# Patient Record
Sex: Female | Born: 1995
Health system: Southern US, Community
[De-identification: ages and names within clinical notes are randomized; demographics above are authoritative.]

## PROBLEM LIST (undated history)

## (undated) ENCOUNTER — Inpatient Hospital Stay (HOSPITAL_COMMUNITY): Payer: Self-pay

## (undated) DIAGNOSIS — F419 Anxiety disorder, unspecified: Secondary | ICD-10-CM

## (undated) DIAGNOSIS — N83209 Unspecified ovarian cyst, unspecified side: Secondary | ICD-10-CM

## (undated) DIAGNOSIS — I493 Ventricular premature depolarization: Secondary | ICD-10-CM

## (undated) DIAGNOSIS — O1493 Unspecified pre-eclampsia, third trimester: Secondary | ICD-10-CM

## (undated) DIAGNOSIS — R519 Headache, unspecified: Secondary | ICD-10-CM

## (undated) DIAGNOSIS — F32A Depression, unspecified: Secondary | ICD-10-CM

## (undated) DIAGNOSIS — N390 Urinary tract infection, site not specified: Secondary | ICD-10-CM

## (undated) HISTORY — PX: LAPAROSCOPY: SHX197

## (undated) HISTORY — PX: CYSTOSCOPY: SUR368

## (undated) HISTORY — PX: WISDOM TOOTH EXTRACTION: SHX21

---
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
## 2014-02-03 IMAGING — CT CT ABD-PELV W/ CM
1 of 2 series · 14 of 32 positions shown, 18 images · IV contrast (OMNIPAQUE 300)
Comparison: CT abdomen pelvis - 10/08/2011

CLINICAL DATA: Abdominal pain, nausea and vomiting, right lower
quadrant pain for 1 day

CT ABDOMEN AND PELVIS WITH CONTRAST
TECHNIQUE: Multidetector CT imaging of the abdomen and pelvis was
performed following the standard protocol during bolus
administration of intravenous contrast.
Contrast: 100mL OMNIPAQUE IOHEXOL 300 MG/ML  SOLN

[Series 2: abd/pel with · axial · 0.58mm/px · z∈[-524,-124]mm · 14 of 88 slices shown, 18 images]
[im 4/88  soft-tissue]
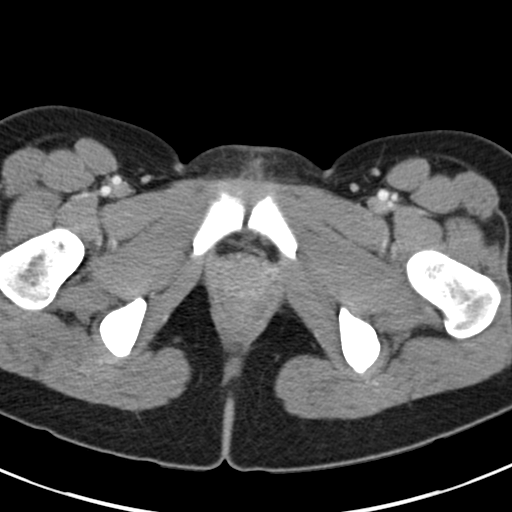
[im 4/88  bone]
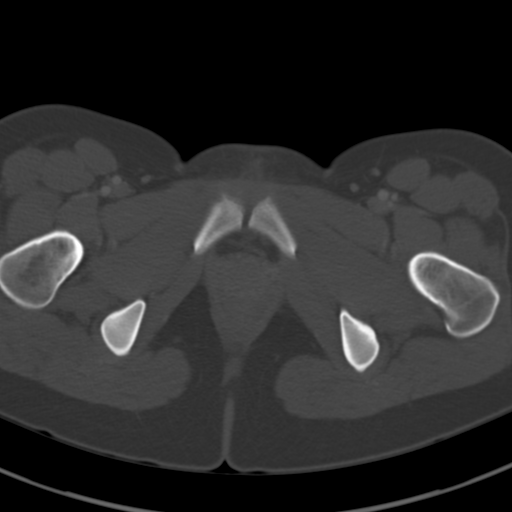
[im 11/88  soft-tissue]
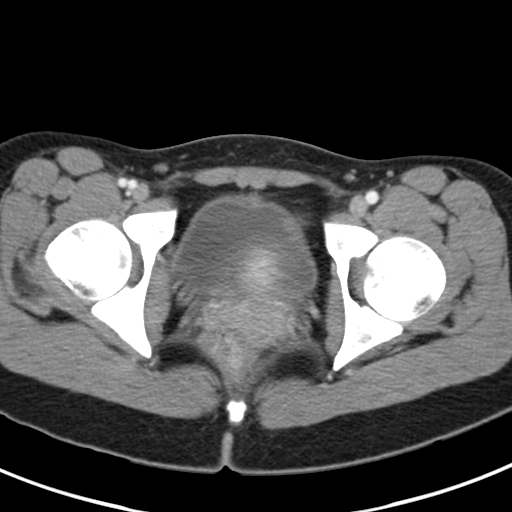
[im 19/88  soft-tissue]
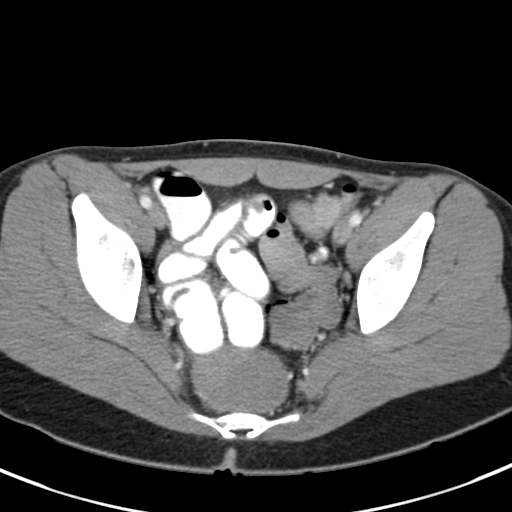
[im 26/88  soft-tissue]
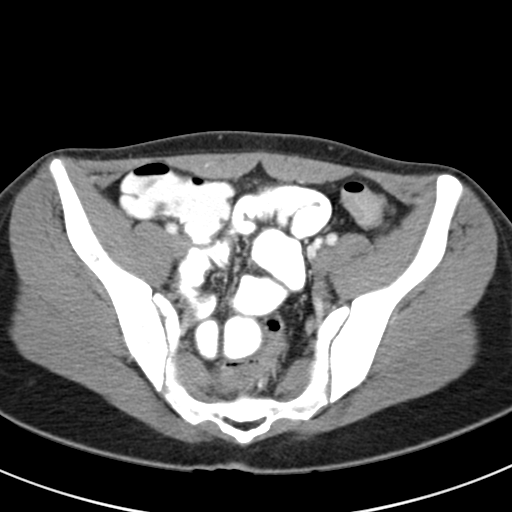
[im 33/88  soft-tissue]
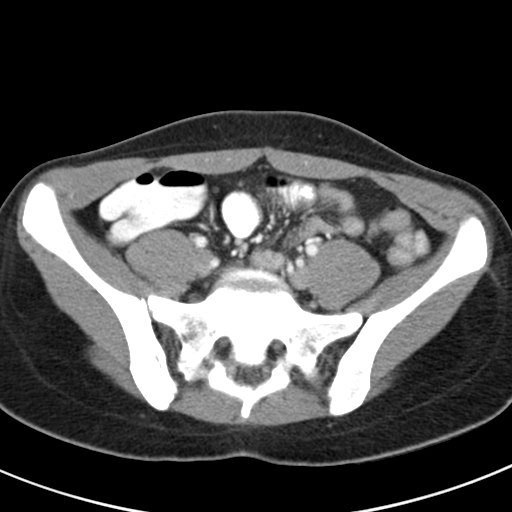
[im 40/88  soft-tissue]
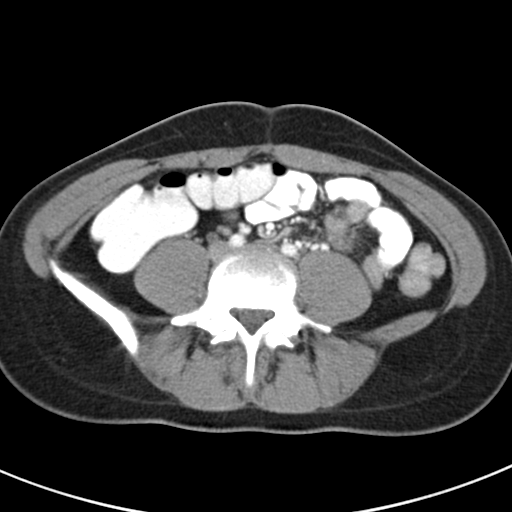
[im 48/88  soft-tissue]
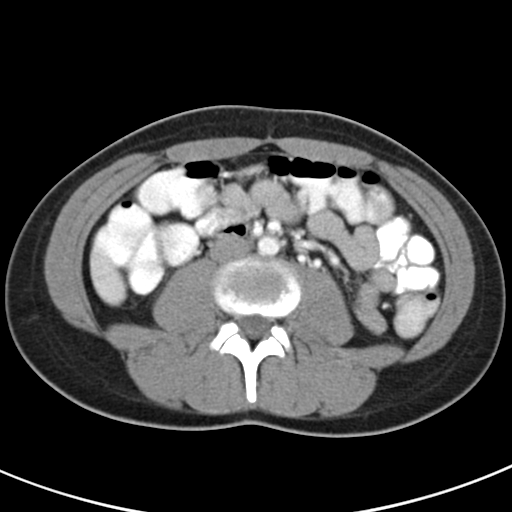
[im 55/88  soft-tissue]
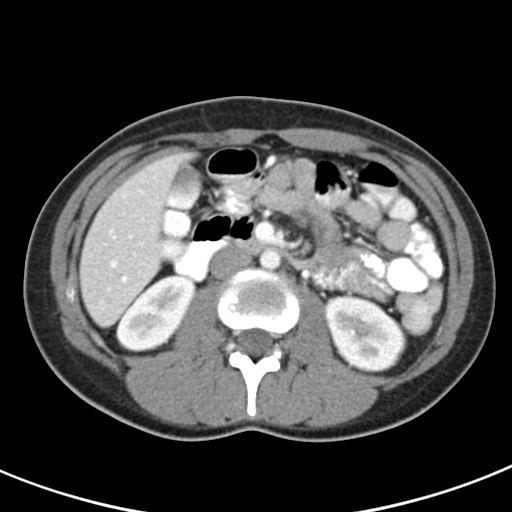
[im 62/88  soft-tissue]
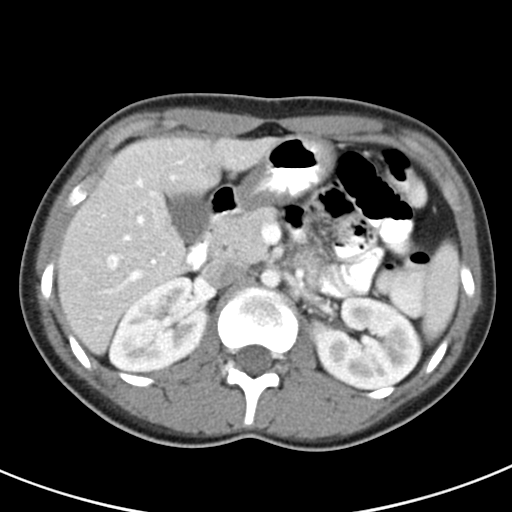
[im 62/88  bone]
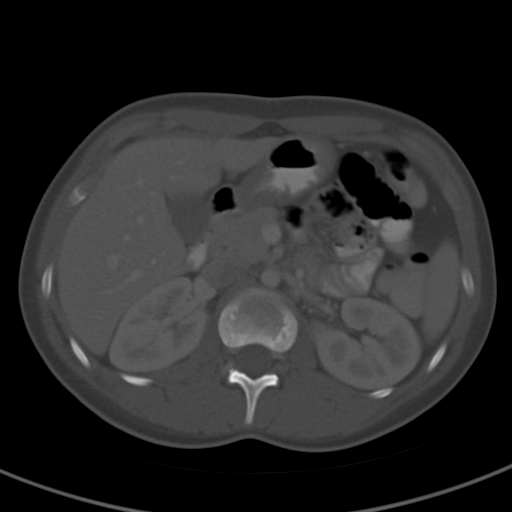
[im 69/88  soft-tissue]
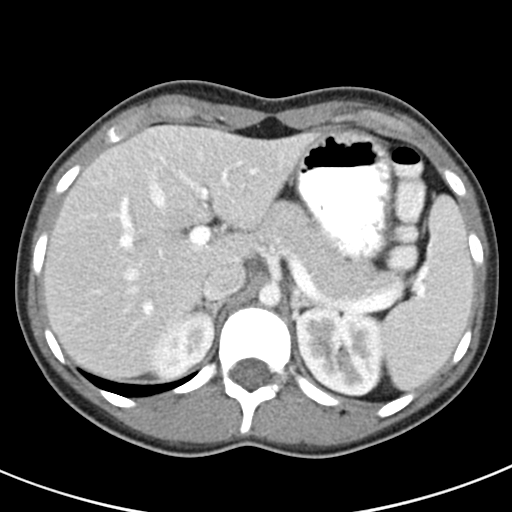
[im 73/88  lung]
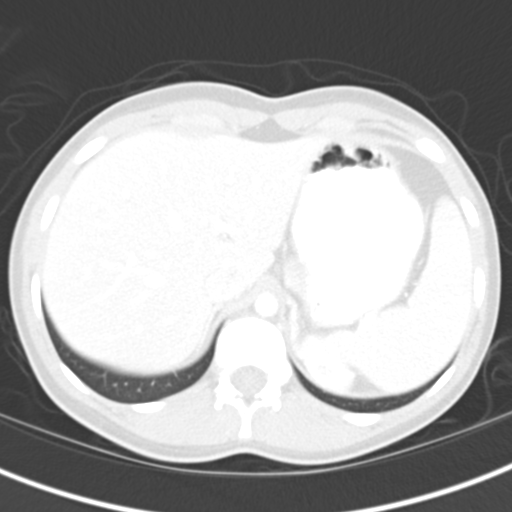
[im 77/88  soft-tissue]
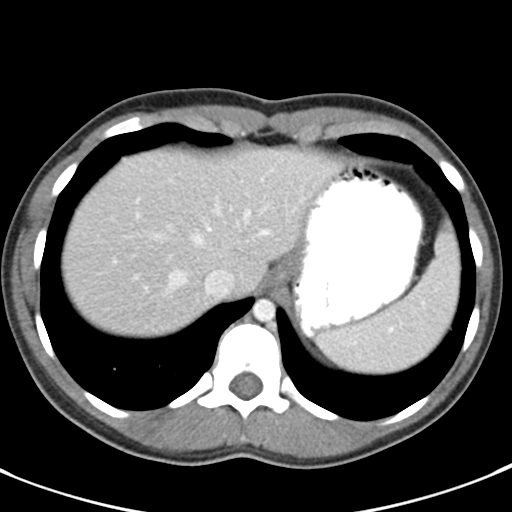
[im 77/88  lung]
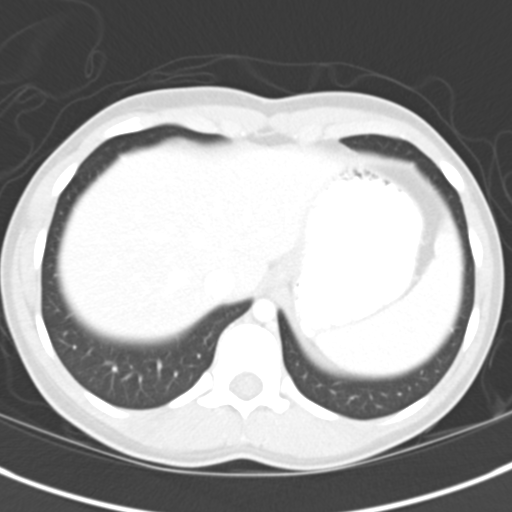
[im 80/88  lung]
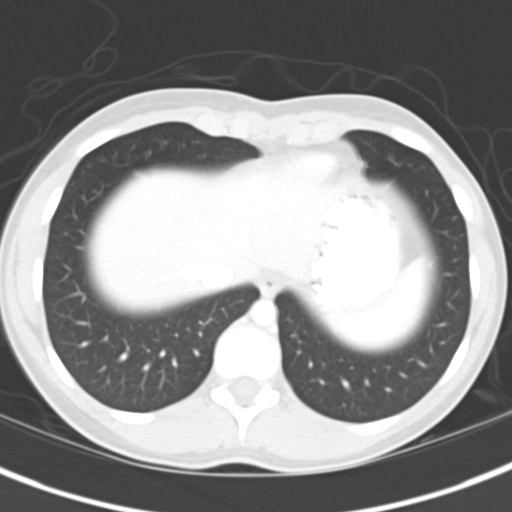
[im 84/88  soft-tissue]
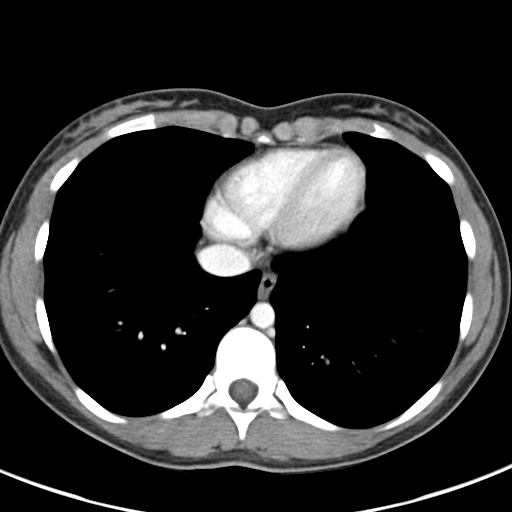
[im 84/88  lung]
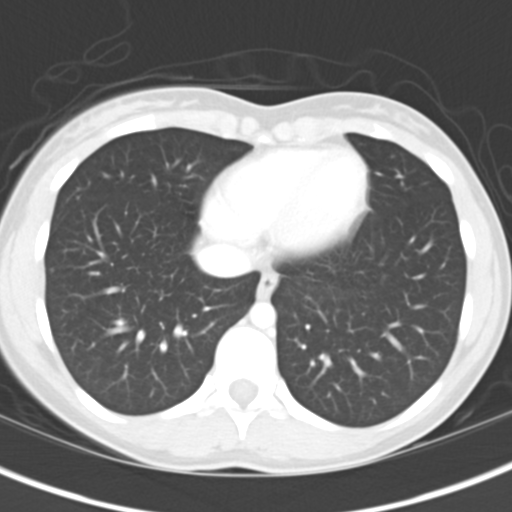

[14 of 32 positions shown; findings below may reference images not displayed]

FINDINGS: Normal hepatic contour.  No discrete hepatic lesions.  Normal
appearance of the gallbladder.  No intrahepatic biliary duct
dilatation.  No ascites.

There is symmetric enhancement of the bilateral kidneys.  No
discrete renal lesions.  No discrete renal stones on this
postcontrast examination.  No urinary obstruction or perinephric
stranding.  Normal appearance of the bilateral adrenal glands,
pancreas and spleen.

Ingested enteric contrast extends to the level of the descending
colon.  No evidence of enteric obstruction. The bowel is normal in
course and caliber without wall thickening or evidence of
obstruction.  Normal appearance of the retrocecal appendix (axial
image 58 through 60, series 2). No pneumoperitoneum, pneumatosis or
portal venous gas.

Normal caliber of the abdominal aorta.  The major branch vessels of
the abdominal aorta are patent.   Index shoddy mesenteric node in
the right lower abdominal quadrant measures 6 mm in short axis
diameter (52, series 2), not enlarged by CT criteria.  Scattered
shoddy retroperitoneal lymph nodes are not enlarged by CT criteria.
No definitive retroperitoneal, mesenteric, pelvic or inguinal
lymphadenopathy.

There is a small amount of fluid within the endometrial canal,
presumably physiologic.  No discrete adnexal lesion. Trace amount
of fluid within the right side of the pelvis (image 73, series 2),
presumably physiologic.

Limited visualization of the lower thorax is negative for focal
airspace opacity or pleural effusion.  Normal heart size.  No
pericardial effusion.

No acute or aggressive osseous abnormalities.
IMPRESSION: 1.  No explanation for patient's nausea, vomiting and right lower
quadrant abdominal pain.  Specifically, no evidence of enteric or
urinary obstruction.  Normal appearance of the appendix.

2.  Small amount presumably physiologic fluid within the
endometrial canal and within the right hemi pelvis.  No discrete
adnexal lesion.

## 2014-02-03 IMAGING — US US TRANSVAGINAL NON-OB
1 series · 13 of 25 positions shown · non-contrast
Comparison: CT scan 06/25/2012.

CLINICAL DATA: Pelvic pain.

TRANSABDOMINAL AND TRANSVAGINAL ULTRASOUND OF PELVIS
DOPPLER ULTRASOUND OF OVARIES
TECHNIQUE: Both transabdominal and transvaginal ultrasound
examinations of the pelvis were performed. Transabdominal technique
was performed for global imaging of the pelvis including uterus,
ovaries, adnexal regions, and pelvic cul-de-sac.
It was necessary to proceed with endovaginal exam following the
transabdominal exam to visualize the ovaries and endometrium.
Color and duplex Doppler ultrasound was utilized to evaluate blood
flow to the ovaries.

[Series 1: us transvaginal non-ob · 0.30mm/px · 13 of 48 slices shown]
[im 1/48]
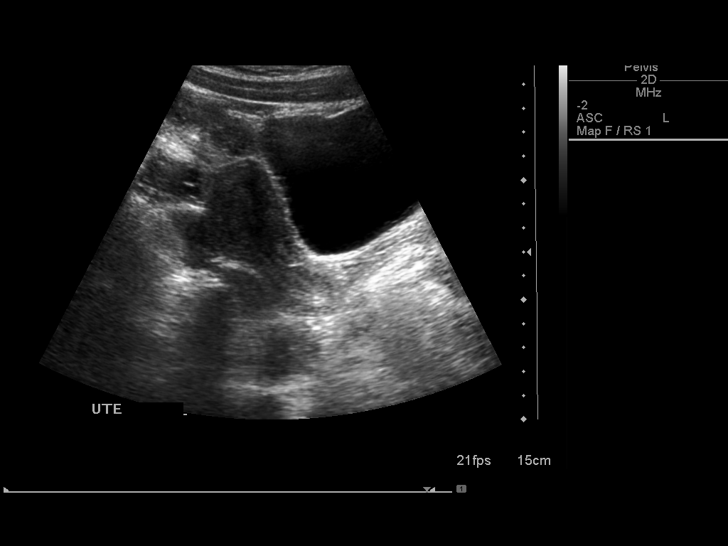
[im 4/48]
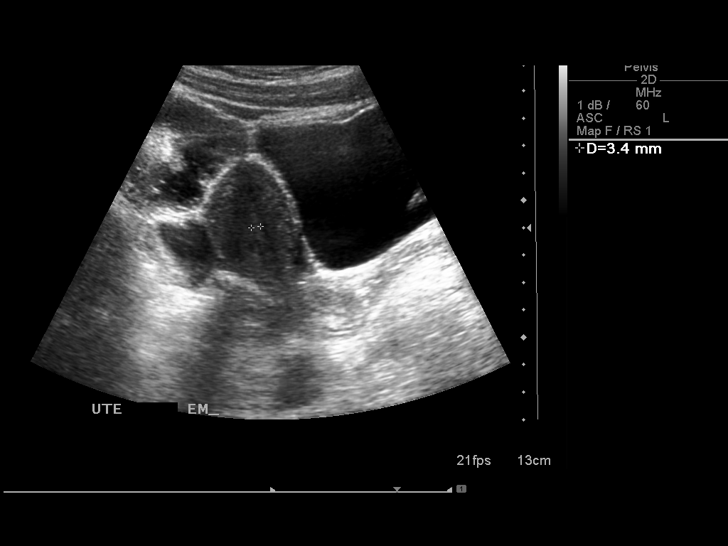
[im 8/48]
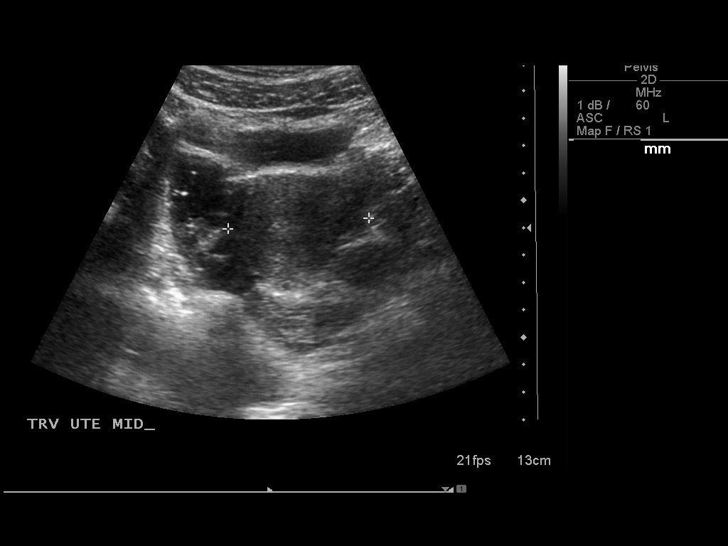
[im 12/48]
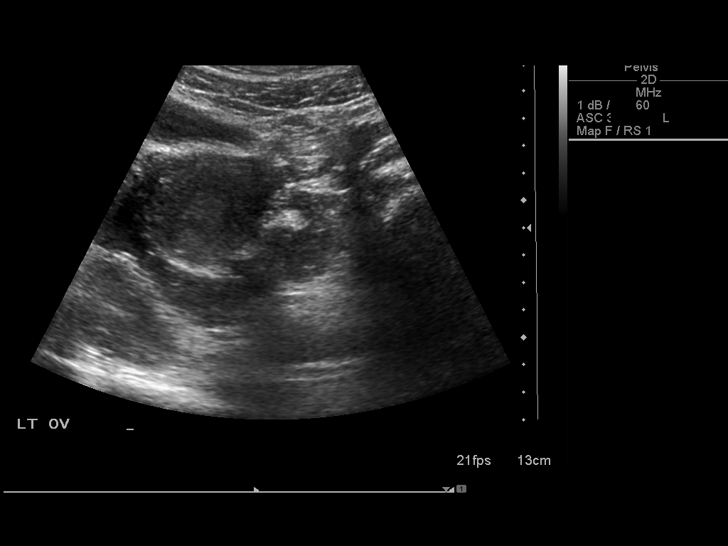
[im 16/48]
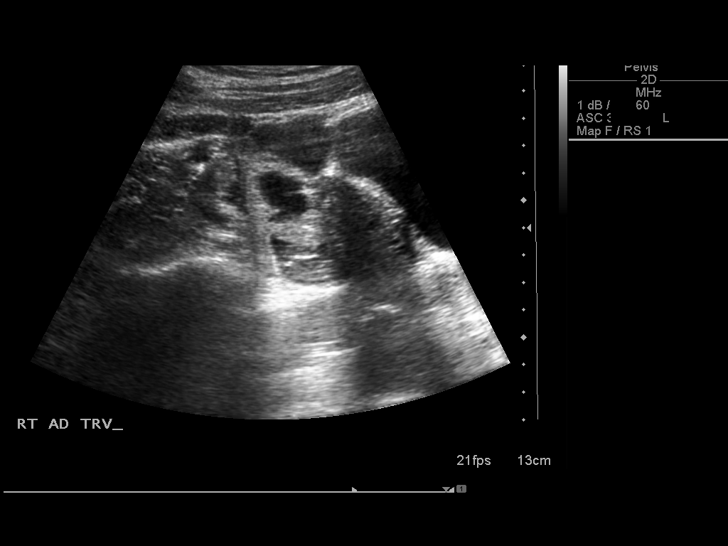
[im 20/48]
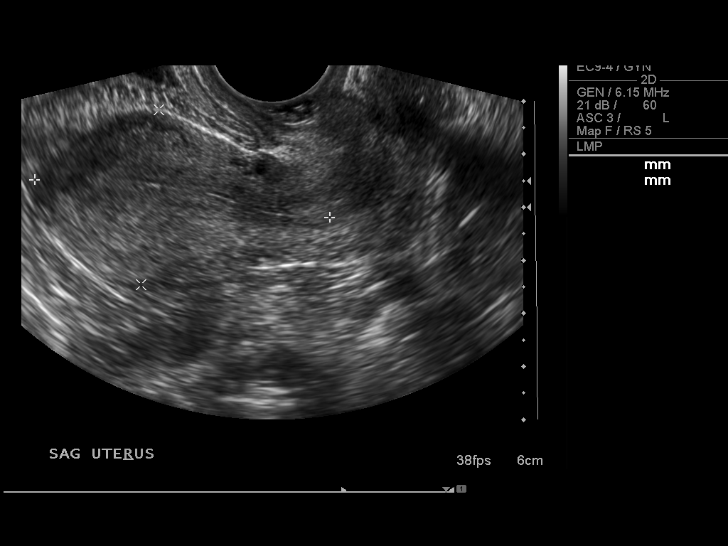
[im 24/48]
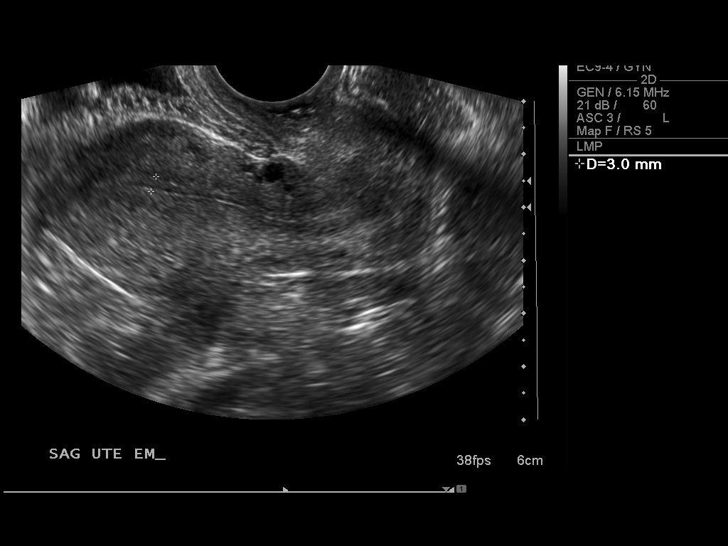
[im 28/48]
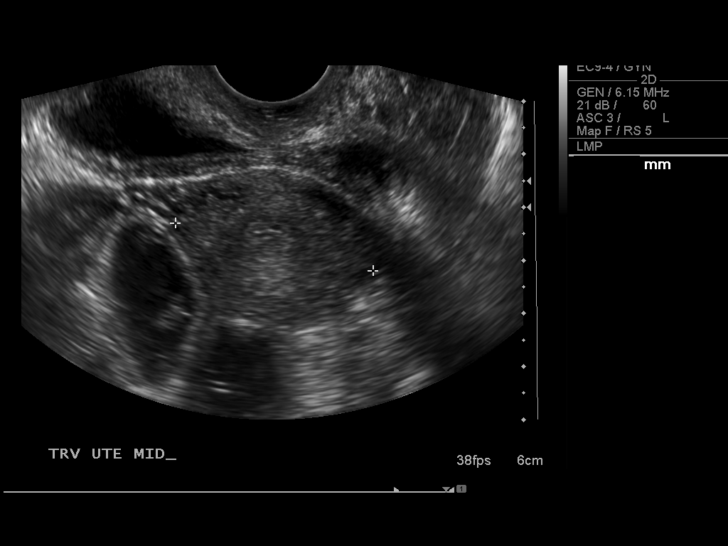
[im 32/48]
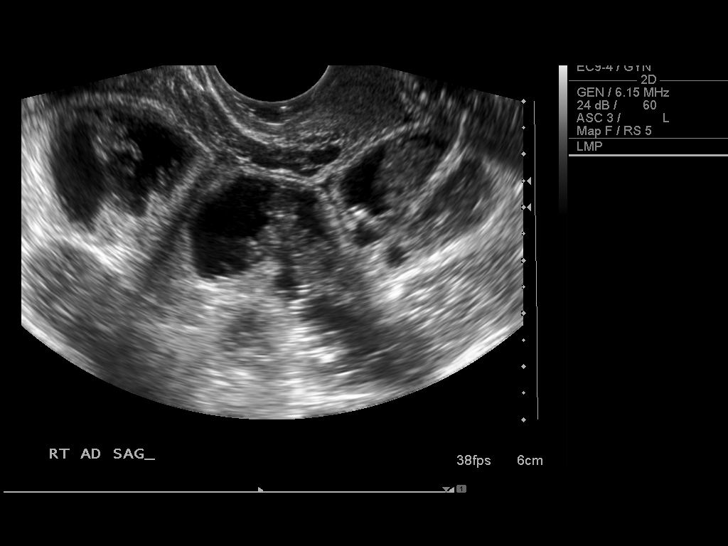
[im 36/48]
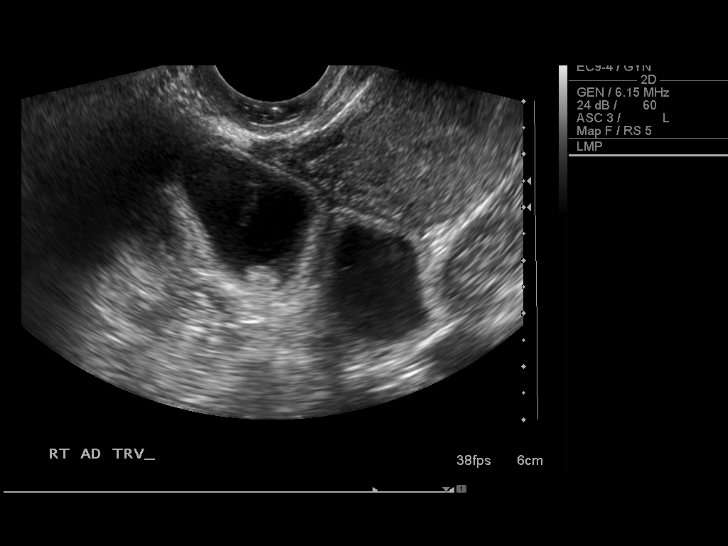
[im 40/48]
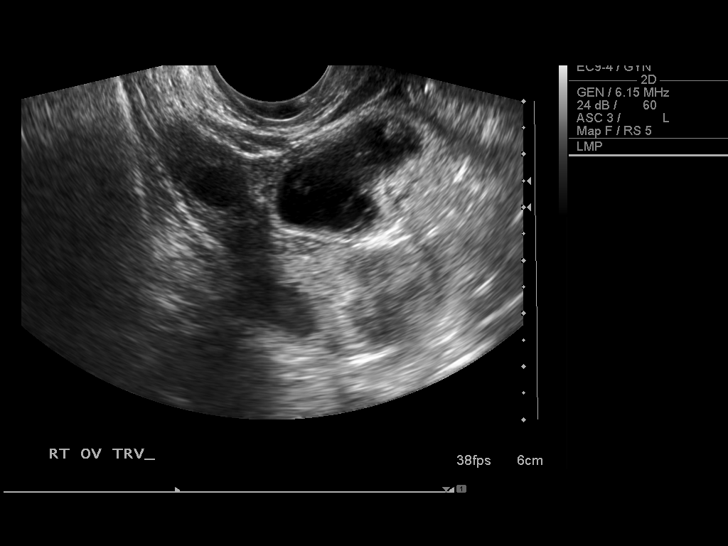
[im 44/48]
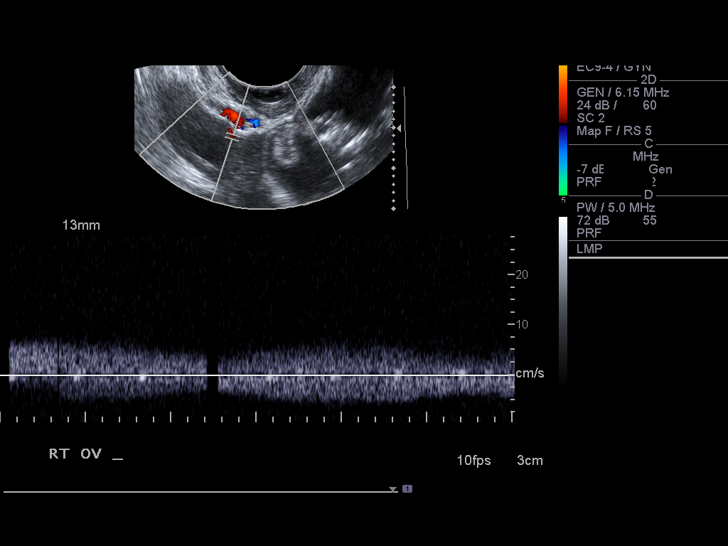
[im 48/48]
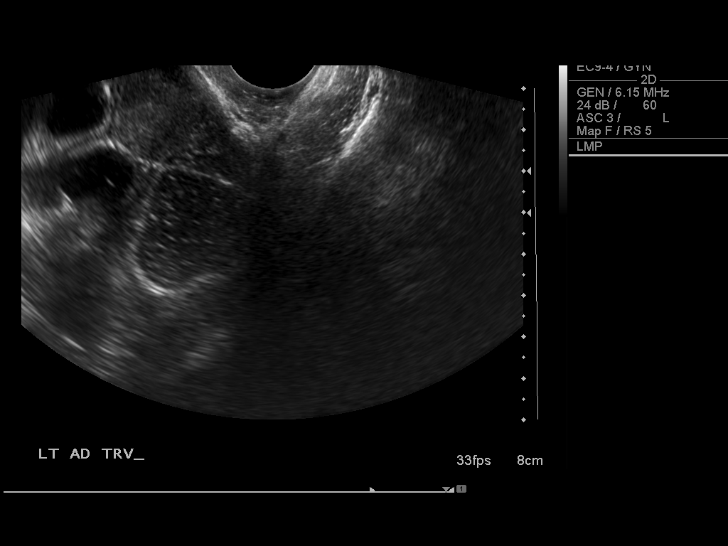

[13 of 25 positions shown; findings below may reference images not displayed]

FINDINGS: Uterus:  Measures 7.9 x 3.4 x 5.2 cm.  No myometrial abnormalities.

Endometrium:  Normal in thickness measuring a maximum of 3 mm.

Right ovary: Measures 2.7 x 1.5 x 2.6 cm.

Left ovary:   Not definitely visualized but appears normal on the
CT scan.

Pulsed Doppler evaluation demonstrates normal low-resistance
arterial and venous waveforms in the right ovary.
IMPRESSION: 1.  Normal sonographic appearance of the uterus and right ovary.
2.  The left ovary is not visualized for certain but it is
identified on the CT scan and appears normal.

No sonographic evidence for ovarian torsion.

## 2014-12-28 ENCOUNTER — Emergency Department: Payer: BLUE CROSS/BLUE SHIELD

## 2014-12-28 ENCOUNTER — Emergency Department
Admission: EM | Admit: 2014-12-28 | Discharge: 2014-12-28 | Disposition: A | Discharge status: Home / Self Care | Payer: BLUE CROSS/BLUE SHIELD | Attending: Emergency Medicine | Admitting: Emergency Medicine

## 2014-12-28 ENCOUNTER — Encounter: Payer: Self-pay | Admitting: *Deleted

## 2014-12-28 DIAGNOSIS — Z3202 Encounter for pregnancy test, result negative: Secondary | ICD-10-CM | POA: Diagnosis not present

## 2014-12-28 DIAGNOSIS — Z79899 Other long term (current) drug therapy: Secondary | ICD-10-CM | POA: Diagnosis not present

## 2014-12-28 DIAGNOSIS — Z793 Long term (current) use of hormonal contraceptives: Secondary | ICD-10-CM | POA: Diagnosis not present

## 2014-12-28 DIAGNOSIS — R102 Pelvic and perineal pain: Secondary | ICD-10-CM

## 2014-12-28 DIAGNOSIS — N8320 Unspecified ovarian cysts: Secondary | ICD-10-CM | POA: Diagnosis not present

## 2014-12-28 DIAGNOSIS — N83209 Unspecified ovarian cyst, unspecified side: Secondary | ICD-10-CM

## 2014-12-28 HISTORY — DX: Unspecified ovarian cyst, unspecified side: N83.209

## 2014-12-28 LAB — URINALYSIS COMPLETE WITH MICROSCOPIC (ARMC ONLY)
BILIRUBIN URINE: NEGATIVE
Glucose, UA: NEGATIVE mg/dL
KETONES UR: NEGATIVE mg/dL
Nitrite: NEGATIVE
PH: 7 (ref 5.0–8.0)
Protein, ur: NEGATIVE mg/dL
Specific Gravity, Urine: 1.019 (ref 1.005–1.030)

## 2014-12-28 LAB — POCT PREGNANCY, URINE: PREG TEST UR: NEGATIVE

## 2014-12-28 MED ORDER — OXYCODONE-ACETAMINOPHEN 5-325 MG PO TABS: 1.0000 | ORAL_TABLET | Freq: Four times a day (QID) | ORAL | Status: DC | PRN

## 2014-12-28 MED ORDER — ONDANSETRON 8 MG PO TBDP
8.0000 mg | ORAL_TABLET | Freq: Once | ORAL | Status: AC
  Administered 2014-12-28: 8 mg via ORAL
  Filled 2014-12-28: qty 1

## 2014-12-28 MED ORDER — HYDROMORPHONE HCL 1 MG/ML IJ SOLN
1.0000 mg | Freq: Once | INTRAMUSCULAR | Status: AC
  Administered 2014-12-28: 1 mg via INTRAMUSCULAR
  Filled 2014-12-28: qty 1

## 2014-12-28 MED ORDER — OXYCODONE-ACETAMINOPHEN 5-325 MG PO TABS
1.0000 | ORAL_TABLET | Freq: Once | ORAL | Status: AC
  Administered 2014-12-28: 1 via ORAL
  Filled 2014-12-28: qty 1

## 2014-12-28 MED ORDER — ONDANSETRON 8 MG PO TBDP: 8.0000 mg | ORAL_TABLET | Freq: Three times a day (TID) | ORAL | Status: DC | PRN

## 2014-12-28 NOTE — Discharge Instructions (Signed)
Ovarian Cyst °An ovarian cyst is a fluid-filled sac that forms on an ovary. The ovaries are small organs that produce eggs in women. Various types of cysts can form on the ovaries. Most are not cancerous. Many do not cause problems, and they often go away on their own. Some may cause symptoms and require treatment. Common types of ovarian cysts include: °· Functional cysts--These cysts may occur every month during the menstrual cycle. This is normal. The cysts usually go away with the next menstrual cycle if the woman does not get pregnant. Usually, there are no symptoms with a functional cyst. °· Endometrioma cysts--These cysts form from the tissue that lines the uterus. They are also called "chocolate cysts" because they become filled with blood that turns brown. This type of cyst can cause pain in the lower abdomen during intercourse and with your menstrual period. °· Cystadenoma cysts--This type develops from the cells on the outside of the ovary. These cysts can get very big and cause lower abdomen pain and pain with intercourse. This type of cyst can twist on itself, cut off its blood supply, and cause severe pain. It can also easily rupture and cause a lot of pain. °· Dermoid cysts--This type of cyst is sometimes found in both ovaries. These cysts may contain different kinds of body tissue, such as skin, teeth, hair, or cartilage. They usually do not cause symptoms unless they get very big. °· Theca lutein cysts--These cysts occur when too much of a certain hormone (human chorionic gonadotropin) is produced and overstimulates the ovaries to produce an egg. This is most common after procedures used to assist with the conception of a baby (in vitro fertilization). °CAUSES  °· Fertility drugs can cause a condition in which multiple large cysts are formed on the ovaries. This is called ovarian hyperstimulation syndrome. °· A condition called polycystic ovary syndrome can cause hormonal imbalances that can lead to  nonfunctional ovarian cysts. °SIGNS AND SYMPTOMS  °Many ovarian cysts do not cause symptoms. If symptoms are present, they may include: °· Pelvic pain or pressure. °· Pain in the lower abdomen. °· Pain during sexual intercourse. °· Increasing girth (swelling) of the abdomen. °· Abnormal menstrual periods. °· Increasing pain with menstrual periods. °· Stopping having menstrual periods without being pregnant. °DIAGNOSIS  °These cysts are commonly found during a routine or annual pelvic exam. Tests may be ordered to find out more about the cyst. These tests may include: °· Ultrasound. °· X-ray of the pelvis. °· CT scan. °· MRI. °· Blood tests. °TREATMENT  °Many ovarian cysts go away on their own without treatment. Your health care provider may want to check your cyst regularly for 2-3 months to see if it changes. For women in menopause, it is particularly important to monitor a cyst closely because of the higher rate of ovarian cancer in menopausal women. When treatment is needed, it may include any of the following: °· A procedure to drain the cyst (aspiration). This may be done using a long needle and ultrasound. It can also be done through a laparoscopic procedure. This involves using a thin, lighted tube with a tiny camera on the end (laparoscope) inserted through a small incision. °· Surgery to remove the whole cyst. This may be done using laparoscopic surgery or an open surgery involving a larger incision in the lower abdomen. °· Hormone treatment or birth control pills. These methods are sometimes used to help dissolve a cyst. °HOME CARE INSTRUCTIONS  °· Only take over-the-counter   or prescription medicines as directed by your health care provider. °· Follow up with your health care provider as directed. °· Get regular pelvic exams and Pap tests. °SEEK MEDICAL CARE IF:  °· Your periods are late, irregular, or painful, or they stop. °· Your pelvic pain or abdominal pain does not go away. °· Your abdomen becomes  larger or swollen. °· You have pressure on your bladder or trouble emptying your bladder completely. °· You have pain during sexual intercourse. °· You have feelings of fullness, pressure, or discomfort in your stomach. °· You lose weight for no apparent reason. °· You feel generally ill. °· You become constipated. °· You lose your appetite. °· You develop acne. °· You have an increase in body and facial hair. °· You are gaining weight, without changing your exercise and eating habits. °· You think you are pregnant. °SEEK IMMEDIATE MEDICAL CARE IF:  °· You have increasing abdominal pain. °· You feel sick to your stomach (nauseous), and you throw up (vomit). °· You develop a fever that comes on suddenly. °· You have abdominal pain during a bowel movement. °· Your menstrual periods become heavier than usual. °MAKE SURE YOU: °· Understand these instructions. °· Will watch your condition. °· Will get help right away if you are not doing well or get worse. °Document Released: 05/16/2005 Document Revised: 05/21/2013 Document Reviewed: 01/21/2013 °[employer]® Patient Information ©2015 [employer]. This information is not intended to replace advice given to you by your health care provider. Make sure you discuss any questions you have with your health care provider. ° ° °You were prescribed a medication that is potentially sedating. Do not drink alcohol, drive or participate in any other potentially dangerous activities while taking this medication as it may make you sleepy. Do not take this medication with any other sedating medications, either prescription or over-the-counter. If you were prescribed Percocet or Vicodin, do not take these with acetaminophen (Tylenol) as it is already contained within these medications. °  °Opioid pain medications (or "narcotics") can be habit forming.  Use it as little as possible to achieve adequate pain control.  Do not use or use it with extreme caution if you have a history of opiate  abuse or dependence.  If you are on a pain contract with your primary care doctor or a pain specialist, be sure to let them know you were prescribed this medication today from the [facility].  This medication is intended for your use only - do not give any to anyone else and keep it in a secure place where nobody else, especially children and pets, have access to it.  It will also cause or worsen constipation, so you may want to consider taking an over-the-counter stool softener while you are taking this medication. ° °

## 2014-12-28 NOTE — ED Notes (Addendum)
[place] reports about 30 minutes until she's ready for pt; visitors x 2 at bedside;

## 2014-12-28 NOTE — ED Notes (Signed)
Pt returned from [place]; rates pain 8/10 after procedure; pt says pain had gone completely away before she went to have that performed

## 2014-12-28 NOTE — ED Notes (Signed)
Dr Scotty Court notified of pt's increased pain; order for 1 percocet given

## 2014-12-28 NOTE — ED Notes (Signed)
MD at bedside to follow up with results 

## 2014-12-28 NOTE — ED Notes (Signed)
Pt c/o R pelvic pain. Pt has hx of ovarian cysts. Pt had no meds to take for pain control at this time. Pt states normally she maintains episodes of cystic pain w/ OTC meds or tramadol. Pt states sudden onset of pain when she woke to use the bathroom and this is atypical of her ovarian cystic pain. Pt denies pregnancy at this time. Pt denies discharge and bleeding at this time, Pt denies dysuria. Pt c/o nausea, denies vomiting.

## 2014-12-28 NOTE — ED Provider Notes (Signed)
[facility] Emergency Department Provider Note  ____________________________________________  Time seen: 2:35 AM  I have reviewed the triage vital signs and the nursing notes.   HISTORY  Chief Complaint Pelvic Pain    HPI Sheila Sexton is a 19 y.o. female who complains of sudden suprapubic and right lower quadrant pain that started at about 1 AM. She reports a history of ovarian cyst, and she was at home and her usual state of health when she was coming back for the bathroom and changing position to sit down on the couch when she had the sudden onset of pain. It radiated to the back and the lower back area. No nausea vomiting chest pain shortness breath fevers chills. No dysuria frequency urgency or vaginal bleeding or discharge. Pain is been constant since onset. It is severe in intensity. Lambert Mody     Past Medical History  Diagnosis Date  . Ovarian cyst     There are no active problems to display for this patient.   History reviewed. No pertinent past surgical history.  Current Outpatient Rx  Name  Route  Sig  Dispense  Refill  . norethindrone-ethinyl estradiol (JUNEL FE,GILDESS FE,LOESTRIN FE) 1-20 MG-MCG tablet   Oral   Take 1 tablet by mouth daily.         . fluconazole (DIFLUCAN) 150 MG tablet      Take one tab PO   1 tablet   0   . omeprazole (PRILOSEC) 40 MG capsule   Oral   Take 40 mg by mouth daily.         . ondansetron (ZOFRAN ODT) 8 MG disintegrating tablet   Oral   Take 1 tablet (8 mg total) by mouth every 8 (eight) hours as needed for nausea or vomiting.   20 tablet   0   . oxyCODONE-acetaminophen (ROXICET) 5-325 MG per tablet   Oral   Take 1 tablet by mouth every 6 (six) hours as needed for severe pain.   12 tablet   0   . promethazine (PHENERGAN) 12.5 MG tablet   Oral   Take 1 tablet (12.5 mg total) by mouth every 6 (six) hours as needed for nausea.   20 tablet   0   . traMADol (ULTRAM) 50 MG tablet   Oral  Take 50 mg by mouth every 6 (six) hours as needed. pain           Allergies Review of patient's allergies indicates no known allergies.  History reviewed. No pertinent family history.  Social History History  Substance Use Topics  . Smoking status: Never Smoker   . Smokeless tobacco: Never Used  . Alcohol Use: No    Review of Systems  Constitutional: No fever or chills. No weight changes Eyes:No blurry vision or double vision.  ENT: No sore throat. Cardiovascular: No chest pain. Respiratory: No dyspnea or cough. Gastrointestinal: Positive suprapubic abdominal pain.  No BRBPR or melena. Genitourinary: Negative for dysuria, urinary retention, bloody urine, or difficulty urinating. Musculoskeletal: Negative for back pain. No joint swelling or pain. Skin: Negative for rash. Neurological: Negative for headaches, focal weakness or numbness. Psychiatric:No anxiety or depression.   Endocrine:No hot/cold intolerance, changes in energy, or sleep difficulty.  10-point ROS otherwise negative.  ____________________________________________   PHYSICAL EXAM:  VITAL SIGNS: ED Triage Vitals  Enc Vitals Group     BP 12/28/14 0156 160/83 mmHg     Pulse Rate 12/28/14 0156 123     Resp 12/28/14 0156 24  Temp 12/28/14 0156 98.2 F (36.8 C)     Temp Source 12/28/14 0156 Oral     SpO2 12/28/14 0156 97 %     Weight 12/28/14 0156 140 lb (63.504 kg)     Height 12/28/14 0156  (1.676 m)     Head Cir --      Peak Flow --      Pain Score 12/28/14 0157 10     Pain Loc --      Pain Edu? --      Excl. in GC? --      Constitutional: Alert and oriented. Well appearing and in no distress. Eyes: No scleral icterus. No conjunctival pallor. PERRL. EOMI ENT   Head: Normocephalic and atraumatic.   Nose: No congestion/rhinnorhea. No septal hematoma   Mouth/Throat: MMM, no pharyngeal erythema. No peritonsillar mass. No uvula shift.   Neck: No stridor. No SubQ emphysema.  No meningismus. Hematological/Lymphatic/Immunilogical: No cervical lymphadenopathy. Cardiovascular: RRR. Normal and symmetric distal pulses are present in all extremities. No murmurs, rubs, or gallops. Respiratory: Normal respiratory effort without tachypnea nor retractions. Breath sounds are clear and equal bilaterally. No wheezes/rales/rhonchi. Gastrointestinal: Suprapubic tenderness. No distention. There is no CVA tenderness.  No rebound, rigidity, or guarding. Genitourinary: deferred Musculoskeletal: Nontender with normal range of motion in all extremities. No joint effusions.  No lower extremity tenderness.  No edema. Neurologic:   Normal speech and language.  CN 2-10 normal. Motor grossly intact. No pronator drift.  Normal gait. No gross focal neurologic deficits are appreciated.  Skin:  Skin is warm, dry and intact. No rash noted.  No petechiae, purpura, or bullae. Psychiatric: Mood and affect are normal. Speech and behavior are normal. Patient exhibits appropriate insight and judgment.  ____________________________________________    LABS (pertinent positives/negatives) (all labs ordered are listed, but only abnormal results are displayed) Labs Reviewed  URINALYSIS COMPLETEWITH MICROSCOPIC ([facility] ONLY) - Abnormal; Notable for the following:    Color, Urine YELLOW (*)    APPearance CLOUDY (*)    Hgb urine dipstick 1+ (*)    Leukocytes, UA TRACE (*)    Bacteria, UA RARE (*)    Squamous Epithelial / LPF 6-30 (*)    All other components within normal limits  POCT PREGNANCY, URINE   ____________________________________________   EKG    ____________________________________________    RADIOLOGY  Ultrasound pelvis including transvaginal and Dopplers indicates a left ovarian hemorrhagic cyst with evidence of recent rupture  ____________________________________________   PROCEDURES  ____________________________________________   INITIAL IMPRESSION / ASSESSMENT AND  PLAN / ED COURSE  Pertinent labs & imaging results that were available during my care of the patient were reviewed by me and considered in my medical decision making (see chart for details).  Urinalysis and urine pregnancy negative. Ultrasound consistent with hemorrhagic ovarian cyst that is ruptured causing the pain. No evidence of significant anemia or blood loss. Control symptoms with Zofran and Percocet and have her follow-up with her gynecologist with whom she has established care. She'll call her gynecologist for an appointment within the next few weeks.    ____________________________________________   FINAL CLINICAL IMPRESSION(S) / ED DIAGNOSES  Final diagnoses:  Pelvic pain in female  Hemorrhagic ovarian cyst      Sharman Cheek, MD 12/28/14 0530

## 2014-12-28 NOTE — ED Notes (Signed)
Pt to US.

## 2017-02-22 ENCOUNTER — Ambulatory Visit (HOSPITAL_COMMUNITY)
Admission: EM | Admit: 2017-02-22 | Discharge: 2017-02-22 | Disposition: A | Discharge status: Other Acute Inpatient Hospital | Payer: BLUE CROSS/BLUE SHIELD

## 2017-02-22 ENCOUNTER — Emergency Department (HOSPITAL_COMMUNITY)
Admission: EM | Admit: 2017-02-22 | Discharge: 2017-02-23 | Discharge status: Against Medical Advice | Payer: BLUE CROSS/BLUE SHIELD

## 2017-02-22 NOTE — ED Notes (Signed)
Pt told registration that she wanted to leave without being seen

## 2017-07-14 ENCOUNTER — Encounter (HOSPITAL_COMMUNITY): Payer: Self-pay | Admitting: Family Medicine

## 2017-07-14 ENCOUNTER — Ambulatory Visit (HOSPITAL_COMMUNITY)
Admission: EM | Admit: 2017-07-14 | Discharge: 2017-07-14 | Disposition: A | Discharge status: Home / Self Care | Payer: 59 | Attending: Family Medicine | Admitting: Family Medicine

## 2017-07-14 DIAGNOSIS — R0981 Nasal congestion: Secondary | ICD-10-CM | POA: Diagnosis present

## 2017-07-14 DIAGNOSIS — J029 Acute pharyngitis, unspecified: Secondary | ICD-10-CM | POA: Diagnosis present

## 2017-07-14 DIAGNOSIS — J0140 Acute pansinusitis, unspecified: Secondary | ICD-10-CM | POA: Insufficient documentation

## 2017-07-14 DIAGNOSIS — R05 Cough: Secondary | ICD-10-CM | POA: Diagnosis present

## 2017-07-14 LAB — POCT RAPID STREP A: Streptococcus, Group A Screen (Direct): NEGATIVE

## 2017-07-14 MED ORDER — IPRATROPIUM BROMIDE 0.06 % NA SOLN: 2.0000 | Freq: Four times a day (QID) | NASAL | 0 refills | Status: DC

## 2017-07-14 MED ORDER — FLUTICASONE PROPIONATE 50 MCG/ACT NA SUSP: 2.0000 | Freq: Every day | NASAL | 0 refills | Status: DC

## 2017-07-14 MED ORDER — BENZONATATE 100 MG PO CAPS: 100.0000 mg | ORAL_CAPSULE | Freq: Three times a day (TID) | ORAL | 0 refills | Status: DC

## 2017-07-14 MED ORDER — AMOXICILLIN-POT CLAVULANATE 875-125 MG PO TABS: 1.0000 | ORAL_TABLET | Freq: Two times a day (BID) | ORAL | 0 refills | Status: DC

## 2017-07-14 NOTE — ED Triage Notes (Signed)
Pt here for URI symptoms x 1 week. Reports taking OTC delsym, robitussin and not better. Denies any fever.

## 2017-07-14 NOTE — ED Provider Notes (Signed)
[facility]    CSN: [id number] Arrival date & time: 07/14/17  1507     History   Chief Complaint Chief Complaint  Patient presents with  . URI    HPI Sheila Sexton is a 22 y.o. female.   22 year old female comes in for 1 week history of URI symptoms. States started out as a sore throat, now with continued nonproductive cough, nasal congestion, rhinorrhea, bilateral ear pain. Unknown fever, with some chills that has since resolved. otc cold medication without relief. Never smoker. Positive sick contact.       Past Medical History:  Diagnosis Date  . Ovarian cyst     There are no active problems to display for this patient.   History reviewed. No pertinent surgical history.  OB History    No data available       Home Medications    Prior to Admission medications   Medication Sig Start Date End Date Taking? Authorizing Provider  amoxicillin-clavulanate (AUGMENTIN) 875-125 MG tablet Take 1 tablet by mouth every 12 (twelve) hours. Fill on 2/19 if symptoms not resolving 07/18/17   Cathie HoopsYu, Amy V, PA-C  benzonatate (TESSALON) 100 MG capsule Take 1 capsule (100 mg total) by mouth every 8 (eight) hours. 07/14/17   Cathie HoopsYu, Amy V, PA-C  fluticasone (FLONASE) 50 MCG/ACT nasal spray Place 2 sprays into both nostrils daily. 07/14/17   Cathie HoopsYu, Amy V, PA-C  ipratropium (ATROVENT) 0.06 % nasal spray Place 2 sprays into both nostrils 4 (four) times daily. 07/14/17   Belinda FisherYu, Amy V, PA-C    Family History History reviewed. No pertinent family history.  Social History Social History   Tobacco Use  . Smoking status: Never Smoker  . Smokeless tobacco: Never Used  Substance Use Topics  . Alcohol use: No  . Drug use: No     Allergies   Patient has no known allergies.   Review of Systems Review of Systems  Reason unable to perform ROS: See HPI as above.     Physical Exam Triage Vital Signs ED Triage Vitals [07/14/17 1624]  Enc Vitals Group     BP 130/78     Pulse Rate 99     Resp 18     Temp 99.1 F (37.3 C)     Temp Source Oral     SpO2 100 %     Weight      Height      Head Circumference      Peak Flow      Pain Score      Pain Loc      Pain Edu?      Excl. in GC?    No data found.  Updated Vital Signs BP 130/78   Pulse 99   Temp 99.1 F (37.3 C) (Oral)   Resp 18   SpO2 100%   Physical Exam  Constitutional: She is oriented to person, place, and time. She appears well-developed and well-nourished. No distress.  HENT:  Head: Normocephalic and atraumatic.  Right Ear: External ear and ear canal normal. Tympanic membrane is erythematous. Tympanic membrane is not bulging.  Left Ear: External ear and ear canal normal. Tympanic membrane is erythematous. Tympanic membrane is not bulging.  Nose: Rhinorrhea present. Right sinus exhibits maxillary sinus tenderness and frontal sinus tenderness. Left sinus exhibits maxillary sinus tenderness and frontal sinus tenderness.  Mouth/Throat: Uvula is midline and mucous membranes are normal. Posterior oropharyngeal erythema present. No tonsillar exudate.  Eyes: Conjunctivae are normal. Pupils are  equal, round, and reactive to light.  Neck: Normal range of motion. Neck supple.  Cardiovascular: Normal rate, regular rhythm and normal heart sounds. Exam reveals no gallop and no friction rub.  No murmur heard. Pulmonary/Chest: Effort normal and breath sounds normal. She has no decreased breath sounds. She has no wheezes. She has no rhonchi. She has no rales.  Lymphadenopathy:    She has no cervical adenopathy.  Neurological: She is alert and oriented to person, place, and time.  Skin: Skin is warm and dry.  Psychiatric: She has a normal mood and affect. Her behavior is normal. Judgment normal.   UC Treatments / Results  Labs (all labs ordered are listed, but only abnormal results are displayed) Labs Reviewed  CULTURE, GROUP A STREP [facility])  POCT RAPID STREP A    EKG  EKG Interpretation None        Radiology No results found.  Procedures Procedures (including critical care time)  Medications Ordered in UC Medications - No data to display   Initial Impression / Assessment and Plan / UC Course  I have reviewed the triage vital signs and the nursing notes.  Pertinent labs & imaging results that were available during my care of the patient were reviewed by me and considered in my medical decision making (see chart for details).    Rapid strep negative. Symptomatic treatment as needed. Rx of Augmentin sent to pharmacy, patient to fill in 3 days if symptoms not improving. Return precautions given.    Final Clinical Impressions(s) / UC Diagnoses   Final diagnoses:  Acute non-recurrent pansinusitis    ED Discharge Orders        Ordered    amoxicillin-clavulanate (AUGMENTIN) 875-125 MG tablet  Every 12 hours     07/14/17 1710    benzonatate (TESSALON) 100 MG capsule  Every 8 hours     07/14/17 1710    fluticasone (FLONASE) 50 MCG/ACT nasal spray  Daily     07/14/17 1710    ipratropium (ATROVENT) 0.06 % nasal spray  4 times daily     07/14/17 1710        [address], [place] 07/14/17 1733

## 2017-07-14 NOTE — Discharge Instructions (Signed)
Rapid strep negative. Tessalon for cough. Start flonase, atrovent nasal spray for nasal congestion/drainage. You can use over the counter nasal saline rinse such as neti pot for nasal congestion. Keep hydrated, your urine should be clear to pale yellow in color. Tylenol/motrin for fever and pain. Monitor for any worsening of symptoms, chest pain, shortness of breath, wheezing, swelling of the throat, follow up for reevaluation.   If symptoms not improving, can fill Augmentin on 2/19 for sinus infection.   For sore throat try using a honey-based tea. Use 3 teaspoons of honey with juice squeezed from half lemon. Place shaved pieces of ginger into 1/2-1 cup of water and warm over stove top. Then mix the ingredients and repeat every 4 hours as needed.

## 2017-07-17 LAB — CULTURE, GROUP A STREP (THRC)

## 2017-09-26 ENCOUNTER — Telehealth: Payer: Self-pay | Admitting: Physician Assistant

## 2017-09-26 NOTE — Telephone Encounter (Signed)
Got a phone call from [place] at [facility].  She has been seeing her for anxiety - physically anxious.  Interested in starting Klonopin up to tid to help control the anxiety.  Needs either lexapro or pristiq. 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  She plans to make an appt.

## 2017-09-28 ENCOUNTER — Ambulatory Visit (INDEPENDENT_AMBULATORY_CARE_PROVIDER_SITE_OTHER): Payer: 59 | Admitting: Physician Assistant

## 2017-09-28 ENCOUNTER — Other Ambulatory Visit: Payer: Self-pay

## 2017-09-28 ENCOUNTER — Encounter: Payer: Self-pay | Admitting: Physician Assistant

## 2017-09-28 VITALS — BP 112/62 | HR 74 | Temp 98.6°F | Resp 16 | Ht 67.0 in | Wt 134.0 lb

## 2017-09-28 DIAGNOSIS — G479 Sleep disorder, unspecified: Secondary | ICD-10-CM

## 2017-09-28 DIAGNOSIS — F410 Panic disorder [episodic paroxysmal anxiety] without agoraphobia: Secondary | ICD-10-CM

## 2017-09-28 DIAGNOSIS — F411 Generalized anxiety disorder: Secondary | ICD-10-CM | POA: Insufficient documentation

## 2017-09-28 MED ORDER — CLONAZEPAM 0.5 MG PO TABS: 0.2500 mg | ORAL_TABLET | Freq: Two times a day (BID) | ORAL | 0 refills | Status: DC | PRN

## 2017-09-28 MED ORDER — ESCITALOPRAM OXALATE 10 MG PO TABS: 10.0000 mg | ORAL_TABLET | Freq: Every day | ORAL | 1 refills | Status: DC

## 2017-09-28 NOTE — Progress Notes (Signed)
Sheila Sexton  MRN: [id number] DOB: 1995/08/01  PCP: Morrell Riddle, PA-C  Chief Complaint  Patient presents with  . Establish Care  . Anxiety    for a long time, GAD score total 18    Subjective:  Pt presents to clinic for establish care.  She is also having trouble with anxiety - she feels like it started before she was even 10 years ago. No childhood trauma or stressful events.  In HS she had a friend who was shot and died and she thinks that may have worsened it but definitely did not cause it. Current physical symptoms - chest pain and SOB daily now that she is in finals - typically her anxiety attacks are triggered.  Triggers are not easly identified - per patient anything can do it but she is able to think back to what caused the anxiety attack.   Sleep is disruptive due to school - when she sleeps - she does not sleep well - 7-8 hours - broken sleep  Eulogio Ditch at [facility] for Cognitive therapy.  Every other week for a month.  Spoke with Victorino Dike regarding patient - see phone message.    History is obtained by patient.  Review of Systems  Psychiatric/Behavioral: Positive for sleep disturbance. Negative for decreased concentration, dysphoric mood and suicidal ideas. The patient is nervous/anxious.     There are no active problems to display for this patient.   No current outpatient medications on file prior to visit.   No current facility-administered medications on file prior to visit.     No Known Allergies  Past Medical History:  Diagnosis Date  . 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 Smoking status: Never Smoker  . Smokeless tobacco: Never Used  Substance Use Topics  . Alcohol use: No  . Drug use: No   family history includes Anxiety disorder in her mother; Cancer in her maternal grandmother; Depression in her  mother; Healthy in her sister and sister.     Objective:  BP 112/62 (BP Location: Right Arm)   Pulse 74   Temp 98.6 F (37 C) (Oral)   Resp 16   Ht  (1.702 m)   Wt 134 lb (60.8 kg)   BMI 20.99 kg/m  Body mass index is 20.99 kg/m.  Physical Exam  Constitutional: She is oriented to person, place, and time. She appears well-developed and well-nourished.  HENT:  Head: Normocephalic and atraumatic.  Right Ear: Hearing and external ear normal.  Left Ear: Hearing and external ear normal.  Eyes: Conjunctivae are normal.  Neck: Normal range of motion.  Cardiovascular: Normal rate, regular rhythm and normal heart sounds.  No murmur heard. Pulmonary/Chest: Effort normal and breath sounds normal. She has no wheezes.  Neurological: She is alert and oriented to person, place, and time.  Skin: Skin is warm and dry.  Psychiatric: She has a normal mood and affect. Her behavior is normal. Judgment and thought content normal.  Vitals reviewed.   Assessment and Plan :  Sleep disturbance - Plan: TSH -so this is likely related to situation  as well as anxiety.  We may need to do medications for this in the future but for now we will do good sleep hygiene.  Generalized anxiety disorder with panic attacks - Plan:  TSH, escitalopram (LEXAPRO) 10 MG tablet, clonazePAM (KLONOPIN) 0.5 MG tablet -patient will continue therapy as this is a very successful treatment for anxiety.  To aid in her physical symptoms we will start Klonopin dosed at a twice a day dose for the next several weeks as the Lexapro gets into her system and starts controlling the chemical imbalance leading to her anxiety.  All medication side effects and what to expect from these medications were discussed with patient's her questions were answered.  She will follow-up with me in 6 weeks after the medication has had time to start working.  Benny Lennert PA-C  Primary Care at [facility] 09/28/2017 3:28 PM

## 2017-09-28 NOTE — Patient Instructions (Addendum)
  Klonopin - for rescue and for right now treatment to prevent physical symptoms.  Lexapro - start with 1/2 pill for 6 days (that is 3 pills) at a time of day where you do not get side effects  Recheck in 6 weeks  Continue therapy   IF you received an x-ray today, you will receive an invoice from [facility]. Please contact [facility] at [phone] with questions or concerns regarding your invoice.   IF you received labwork today, you will receive an invoice from [facility]. Please contact [facility] at [phone] with questions or concerns regarding your invoice.   Our billing staff will not be able to assist you with questions regarding bills from these companies.  You will be contacted with the lab results as soon as they are available. The fastest way to get your results is to activate your My Chart account. Instructions are located on the last page of this paperwork. If you have not heard from [place] regarding the results in 2 weeks, please contact this office.

## 2017-09-29 LAB — TSH: TSH: 0.632 u[IU]/mL (ref 0.450–4.500)

## 2017-10-03 ENCOUNTER — Encounter: Payer: Self-pay | Admitting: Physician Assistant

## 2017-10-18 ENCOUNTER — Encounter: Payer: Self-pay | Admitting: Physician Assistant

## 2017-10-18 DIAGNOSIS — F411 Generalized anxiety disorder: Principal | ICD-10-CM

## 2017-10-18 DIAGNOSIS — F410 Panic disorder [episodic paroxysmal anxiety] without agoraphobia: Secondary | ICD-10-CM

## 2017-10-19 ENCOUNTER — Telehealth: Payer: 59 | Admitting: Physician Assistant

## 2017-10-19 DIAGNOSIS — B9689 Other specified bacterial agents as the cause of diseases classified elsewhere: Secondary | ICD-10-CM | POA: Diagnosis not present

## 2017-10-19 DIAGNOSIS — J019 Acute sinusitis, unspecified: Secondary | ICD-10-CM | POA: Diagnosis not present

## 2017-10-19 MED ORDER — AMOXICILLIN-POT CLAVULANATE 875-125 MG PO TABS: 1.0000 | ORAL_TABLET | Freq: Two times a day (BID) | ORAL | 0 refills | Status: DC

## 2017-10-19 NOTE — Progress Notes (Signed)

## 2017-10-20 MED ORDER — CLONAZEPAM 0.5 MG PO TABS: 0.2500 mg | ORAL_TABLET | Freq: Two times a day (BID) | ORAL | 0 refills | Status: DC

## 2017-10-26 DIAGNOSIS — N762 Acute vulvitis: Secondary | ICD-10-CM | POA: Diagnosis not present

## 2017-10-26 DIAGNOSIS — N764 Abscess of vulva: Secondary | ICD-10-CM | POA: Diagnosis not present

## 2017-10-26 DIAGNOSIS — R102 Pelvic and perineal pain: Secondary | ICD-10-CM | POA: Diagnosis not present

## 2017-11-07 ENCOUNTER — Encounter: Payer: Self-pay | Admitting: Physician Assistant

## 2017-11-07 ENCOUNTER — Ambulatory Visit: Payer: 59 | Admitting: Physician Assistant

## 2017-11-07 ENCOUNTER — Other Ambulatory Visit: Payer: Self-pay

## 2017-11-07 DIAGNOSIS — F411 Generalized anxiety disorder: Secondary | ICD-10-CM | POA: Diagnosis not present

## 2017-11-07 DIAGNOSIS — F410 Panic disorder [episodic paroxysmal anxiety] without agoraphobia: Secondary | ICD-10-CM

## 2017-11-07 MED ORDER — CLONAZEPAM 0.5 MG PO TABS: 0.2500 mg | ORAL_TABLET | Freq: Two times a day (BID) | ORAL | 0 refills | Status: DC

## 2017-11-07 MED ORDER — ESCITALOPRAM OXALATE 20 MG PO TABS: 20.0000 mg | ORAL_TABLET | Freq: Every day | ORAL | 0 refills | Status: DC

## 2017-11-07 NOTE — Patient Instructions (Signed)
     IF you received an x-ray today, you will receive an invoice from [facility]. Please contact [facility] at [phone] with questions or concerns regarding your invoice.   IF you received labwork today, you will receive an invoice from [facility]. Please contact [facility] at [phone] with questions or concerns regarding your invoice.   Our billing staff will not be able to assist you with questions regarding bills from these companies.  You will be contacted with the lab results as soon as they are available. The fastest way to get your results is to activate your My Chart account. Instructions are located on the last page of this paperwork. If you have not heard from us regarding the results in 2 weeks, please contact this office.     

## 2017-11-07 NOTE — Progress Notes (Signed)
Sheila Sexton  MRN: [id number] DOB: Aug 10, 1995  PCP: Morrell Riddle, PA-C  Chief Complaint  Patient presents with  . Anxiety    follow up     Subjective:  Pt presents to clinic for medication recheck.  She is tolerating the Lexapro ok - she at the beginning was having some side effects but she was taking it before her sleep which was at different times every day dut to working 3rd shifts some days.  Now that she is taking it the same time every day she is tolerating it fine.  She is also taking the klonopin bid and feels like it is working well -she takes a 1/2 pill at these dose.  She is sleeping better on the klonopin. She is still doing therapy and being on these medications has helped her therapy.  She is about 40% better - she finds her anxiety is worse with testing and when there is a lot going on.    History is obtained by patient.  Review of Systems  Psychiatric/Behavioral: Positive for sleep disturbance (better with medication). Negative for dysphoric mood. The patient is nervous/anxious.     Patient Active Problem List   Diagnosis Date Noted  . Generalized anxiety disorder with panic attacks 09/28/2017  . Sleep disturbance 09/28/2017    No current outpatient medications on file prior to visit.   No current facility-administered medications on file prior to visit.     No Known Allergies  Past Medical History:  Diagnosis Date  . 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 Smoking status: Never Smoker  . Smokeless tobacco: Never Used  Substance Use Topics  . Alcohol use: No  . Drug use: No   family history includes Anxiety disorder in her mother; Cancer in her maternal grandmother; Depression in her mother; Healthy in her sister and sister.     Objective:  BP 112/60   Pulse 90   Temp 99.1 F (37.3  C) (Oral)   Resp 18   Ht 5\' 7"  (1.702 m)   Wt 137 lb 9.6 oz (62.4 kg)   SpO2 97%   BMI 21.55 kg/m  Body mass index is 21.55 kg/m.  Wt Readings from Last 3 Encounters:  11/07/17 137 lb 9.6 oz (62.4 kg)  09/28/17 134 lb (60.8 kg)  02/22/17 134 lb (60.8 kg)    Physical Exam  Constitutional: She is oriented to person, place, and time. She appears well-developed and well-nourished.  HENT:  Head: Normocephalic and atraumatic.  Right Ear: Hearing and external ear normal.  Left Ear: Hearing and external ear normal.  Eyes: Conjunctivae are normal.  Neck: Normal range of motion.  Pulmonary/Chest: Effort normal.  Neurological: She is alert and oriented to person, place, and time.  Skin: Skin is warm, dry and intact.  Psychiatric: She has a normal mood and affect. Her behavior is normal. Judgment and thought content normal.  Vitals reviewed.   Assessment and Plan :  Generalized anxiety disorder with panic attacks - Plan: escitalopram (LEXAPRO) 20 MG tablet, clonazePAM (KLONOPIN) 0.5 MG tablet   Continue therapy.  Increase Lexapro to 20 mg to see if we can get better control of her anxiety.  Continue Klonopin.  Within the next 2 weeks patient will try to decrease  Klonopin dose at her least anxious time of day.  Depending on her results we will determine the next step at her follow-up in 5 to 6 weeks.  She is less anxious she is better able to follow through with her therapy plan.   Benny LennertSarah Luanne Krzyzanowski PA-C  Primary Care at [facility] 11/07/2017 6:57 PM

## 2017-11-10 ENCOUNTER — Ambulatory Visit: Payer: 59 | Admitting: Physician Assistant

## 2017-12-13 ENCOUNTER — Encounter: Payer: Self-pay | Admitting: *Deleted

## 2017-12-22 ENCOUNTER — Other Ambulatory Visit: Payer: Self-pay

## 2017-12-22 ENCOUNTER — Ambulatory Visit: Payer: 59 | Admitting: Physician Assistant

## 2017-12-22 ENCOUNTER — Encounter: Payer: Self-pay | Admitting: Physician Assistant

## 2017-12-22 VITALS — BP 110/60 | HR 109 | Temp 99.3°F | Resp 18 | Ht 67.0 in | Wt 137.6 lb

## 2017-12-22 DIAGNOSIS — F411 Generalized anxiety disorder: Secondary | ICD-10-CM | POA: Diagnosis not present

## 2017-12-22 DIAGNOSIS — R55 Syncope and collapse: Secondary | ICD-10-CM | POA: Diagnosis not present

## 2017-12-22 DIAGNOSIS — F410 Panic disorder [episodic paroxysmal anxiety] without agoraphobia: Secondary | ICD-10-CM

## 2017-12-22 MED ORDER — ESCITALOPRAM OXALATE 20 MG PO TABS: 20.0000 mg | ORAL_TABLET | Freq: Every day | ORAL | 0 refills | Status: DC

## 2017-12-22 NOTE — Progress Notes (Signed)
Sheila Sexton  MRN: [id number] DOB: Jan 29, 1996  PCP: Morrell Riddle, PA-C  Chief Complaint  Patient presents with  . Anxiety    follow up     Subjective:  Pt presents to clinic for anxiety recheck.  Overall she is feeling very good.  She felt like she was doing much better until last week when she had a slight flare of her anxiety.  I currently t is still better than it was when she started medication.  She is not use Klonopin in about 3 weeks.  After her last visit she took it for a few days on the higher dose and noticed that she was quite groggy.  That was the timeframe she started titrating down.  Even during her last week with heightened anxiety she did not feel like it her anxiety was bad enough to use her Klonopin.  She continues to do biweekly therapy with Daphene Calamity at [facility] for cognitive therapy.  She plans on continuing this therapy.  She has had another episode of feeling like she is going to pass out that she had did not pass out.  She went from sitting in a car walking to a restaurant sitting down and starting to feel cold but sweaty at the same time.  This lasted several minutes and then resolved.  Earlier that morning she had eaten a very good breakfast and this was about 3 hours later at lunch.  She does try to drink a good amount of water, mainly clear urine.  She eats regularly but sometimes at work forgets this and then understands why she feels bad.  She does not eat a lot of salt.  She does have episodes with change of position that she feels a little lightheaded.  But not always.  History is obtained by patient.  Review of Systems  Constitutional: Negative for chills and fever.  HENT: Negative.   Neurological: Positive for light-headedness. Negative for dizziness, weakness and headaches.  Psychiatric/Behavioral: Negative for dysphoric mood. The patient is nervous/anxious.     Patient Active Problem List   Diagnosis Date Noted  . Generalized anxiety disorder  with panic attacks 09/28/2017  . Sleep disturbance 09/28/2017    Current Outpatient Medications on File Prior to Visit  Medication Sig Dispense Refill  . clonazePAM (KLONOPIN) 0.5 MG tablet Take 0.5 tablets (0.25 mg total) by mouth 2 (two) times daily. 30 tablet 0   No current facility-administered medications on file prior to visit.     No Known Allergies  Past Medical History:  Diagnosis Date  . 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 Smoking status: Never Smoker  . Smokeless tobacco: Never Used  Substance Use Topics  . Alcohol use: No  . Drug use: No   family history includes Anxiety disorder in her mother; Cancer in her maternal grandmother; Depression in her mother; Healthy in her sister and sister.     Objective:  BP 110/60   Pulse (!) 109   Temp 99.3 F (37.4 C) (Oral)   Resp 18   Ht 5\' 7"  (1.702 m)   Wt 137 lb 9.6 oz (62.4 kg)   SpO2 97%   BMI 21.55 kg/m  Body mass index is 21.55 kg/m.  Wt Readings from Last 3  Encounters:  12/22/17 137 lb 9.6 oz (62.4 kg)  11/07/17 137 lb 9.6 oz (62.4 kg)  09/28/17 134 lb (60.8 kg)    Physical Exam  Constitutional: She is oriented to person, place, and time.  HENT:  Head: Normocephalic and atraumatic.  Right Ear: Hearing and external ear normal.  Left Ear: Hearing and external ear normal.  Eyes: Lids are normal.  Neck: Normal range of motion.  Cardiovascular: Normal rate, regular rhythm and normal heart sounds.  No murmur heard. Pulmonary/Chest: Effort normal and breath sounds normal. She has no wheezes.  Neurological: She is alert and oriented to person, place, and time. Gait normal.  Skin: Skin is warm and dry.  Psychiatric: She has a normal mood and affect. Her behavior is normal. Judgment and thought content normal.  Vitals  reviewed.   Assessment and Plan :  Generalized anxiety disorder with panic attacks - Plan: escitalopram (LEXAPRO) 20 MG tablet patient has had much better control of her anxiety with increased dose of Lexapro.  She is used no Klonopin in the past 3 weeks which is very encouraging.  She had increased anxiety this week and still did not feel like her anxiety warranted Klonopin use.  She should continue therapy.  We will continue with this dose of Lexapro for the next 3 months unless she has any changes and she will contact me.  She will continue use Klonopin as needed.-   Pre-syncope patient had a presyncopal episode -which did not seem to have any events leading up to this that can be identified.  She will continue to stay well-hydrated and eat regularly.  She will try to increase her salt as her blood pressure is quite low and she may be having some postural hypotension.  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  This continues to get worse meeting last longer more frequent she will return to clinic for further work-up.  Patient verbalized to me that they understand the following: diagnosis, what is being done for them, what to expect and what should be done at home.  Their questions have been answered.  See after visit summary for patient specific instructions.  Benny LennertSarah Alejandrina Raimer PA-C  Primary Care at [facility] 12/22/2017 12:40 PM  Please note: Portions of this report may have been transcribed using dragon voice recognition software. Every effort was made to ensure accuracy; however, inadvertent computerized transcription errors may be present.

## 2017-12-22 NOTE — Patient Instructions (Addendum)
[facility] - [address], [place], [place] [address] Phone: [phone]     IF you received an x-ray today, you will receive an invoice from [facility]. Please contact [facility] at [phone] with questions or concerns regarding your invoice.   IF you received labwork today, you will receive an invoice from [facility]. Please contact [facility] at [phone] with questions or concerns regarding your invoice.   Our billing staff will not be able to assist you with questions regarding bills from these companies.  You will be contacted with the lab results as soon as they are available. The fastest way to get your results is to activate your My Chart account. Instructions are located on the last page of this paperwork. If you have not heard from us regarding the results in 2 weeks, please contact this office.     

## 2018-03-20 DIAGNOSIS — Z3490 Encounter for supervision of normal pregnancy, unspecified, unspecified trimester: Secondary | ICD-10-CM | POA: Insufficient documentation

## 2018-04-28 ENCOUNTER — Inpatient Hospital Stay (HOSPITAL_COMMUNITY)
Admission: AD | Admit: 2018-04-28 | Discharge: 2018-04-28 | Disposition: A | Discharge status: Home / Self Care | Payer: 59 | Source: Ambulatory Visit | Attending: Obstetrics and Gynecology | Admitting: Obstetrics and Gynecology

## 2018-04-28 ENCOUNTER — Encounter (HOSPITAL_COMMUNITY): Payer: Self-pay | Admitting: *Deleted

## 2018-04-28 ENCOUNTER — Inpatient Hospital Stay (HOSPITAL_COMMUNITY): Payer: 59

## 2018-04-28 DIAGNOSIS — R109 Unspecified abdominal pain: Secondary | ICD-10-CM | POA: Diagnosis not present

## 2018-04-28 DIAGNOSIS — Z3A01 Less than 8 weeks gestation of pregnancy: Secondary | ICD-10-CM | POA: Diagnosis not present

## 2018-04-28 DIAGNOSIS — O26891 Other specified pregnancy related conditions, first trimester: Secondary | ICD-10-CM | POA: Diagnosis present

## 2018-04-28 DIAGNOSIS — Z79899 Other long term (current) drug therapy: Secondary | ICD-10-CM | POA: Insufficient documentation

## 2018-04-28 DIAGNOSIS — R1030 Lower abdominal pain, unspecified: Secondary | ICD-10-CM | POA: Diagnosis not present

## 2018-04-28 LAB — POCT PREGNANCY, URINE: Preg Test, Ur: POSITIVE — AB

## 2018-04-28 LAB — URINALYSIS, ROUTINE W REFLEX MICROSCOPIC
BILIRUBIN URINE: NEGATIVE
Glucose, UA: NEGATIVE mg/dL
HGB URINE DIPSTICK: NEGATIVE
Ketones, ur: NEGATIVE mg/dL
NITRITE: NEGATIVE
PH: 6 (ref 5.0–8.0)
Protein, ur: NEGATIVE mg/dL
Specific Gravity, Urine: 1.003 — ABNORMAL LOW (ref 1.005–1.030)

## 2018-04-28 LAB — CBC
HEMATOCRIT: 39.5 % (ref 36.0–46.0)
Hemoglobin: 13.3 g/dL (ref 12.0–15.0)
MCH: 31.1 pg (ref 26.0–34.0)
MCHC: 33.7 g/dL (ref 30.0–36.0)
MCV: 92.3 fL (ref 80.0–100.0)
Platelets: 248 10*3/uL (ref 150–400)
RBC: 4.28 MIL/uL (ref 3.87–5.11)
RDW: 12.3 % (ref 11.5–15.5)
WBC: 7.5 10*3/uL (ref 4.0–10.5)
nRBC: 0 % (ref 0.0–0.2)

## 2018-04-28 LAB — WET PREP, GENITAL
Clue Cells Wet Prep HPF POC: NONE SEEN
Sperm: NONE SEEN
Trich, Wet Prep: NONE SEEN
Yeast Wet Prep HPF POC: NONE SEEN

## 2018-04-28 LAB — OB RESULTS CONSOLE GC/CHLAMYDIA: Gonorrhea: NEGATIVE

## 2018-04-28 LAB — OB RESULTS CONSOLE HIV ANTIBODY (ROUTINE TESTING): HIV: NONREACTIVE

## 2018-04-28 LAB — HCG, QUANTITATIVE, PREGNANCY: hCG, Beta Chain, Quant, S: 21961 m[IU]/mL — ABNORMAL HIGH (ref <5)

## 2018-04-28 NOTE — Discharge Instructions (Signed)
Drink at least 8 8-oz glasses of water every day. Take Tylenol 325 mg 2 tablets by mouth every 4 hours if needed for pain.  Safe Medications in Pregnancy   Acne: Benzoyl Peroxide Salicylic Acid  Backache/Headache: Tylenol: 2 regular strength every 4 hours OR              2 Extra strength every 6 hours  Colds/Coughs/Allergies: Benadryl (alcohol free) 25 mg every 6 hours as needed Breath right strips Claritin Cepacol throat lozenges Chloraseptic throat spray Cold-Eeze- up to three times per day Cough drops, alcohol free Flonase (by prescription only) Guaifenesin Mucinex Robitussin DM (plain only, alcohol free) Saline nasal spray/drops Sudafed (pseudoephedrine) & Actifed ** use only after [redacted] weeks gestation and if you do not have high blood pressure Tylenol Vicks Vaporub Zinc lozenges Zyrtec   Constipation: Colace Ducolax suppositories Fleet enema Glycerin suppositories Metamucil Milk of magnesia Miralax Senokot Smooth move tea  Diarrhea: Kaopectate Imodium A-D  *NO pepto Bismol  Hemorrhoids: Anusol Anusol HC Preparation H Tucks  Indigestion: Tums Maalox Mylanta Zantac  Pepcid  Insomnia: Benadryl (alcohol free) 25mg  every 6 hours as needed Tylenol PM Unisom, no Gelcaps  Leg Cramps: Tums MagGel  Nausea/Vomiting:  Bonine Dramamine Emetrol Ginger extract Sea bands Meclizine  Nausea medication to take during pregnancy:  Unisom (doxylamine succinate 25 mg tablets) Take one tablet daily at bedtime. If symptoms are not adequately controlled, the dose can be increased to a maximum recommended dose of two tablets daily (1/2 tablet in the morning, 1/2 tablet mid-afternoon and one at bedtime). Vitamin B6 100mg  tablets. Take one tablet twice a day (up to 200 mg per day).  Skin Rashes: Aveeno products Benadryl cream or 25mg  every 6 hours as needed Calamine Lotion 1% cortisone cream  Yeast infection: Gyne-lotrimin 7 Monistat 7   **If taking  multiple medications, please check labels to avoid duplicating the same active ingredients **take medication as directed on the label ** Do not exceed 4000 mg of tylenol in 24 hours **Do not take medications that contain aspirin or ibuprofen

## 2018-04-28 NOTE — Progress Notes (Signed)
Terri Burleson NP in earlier to discuss test results and d/c plan. Written and verbal d/c instructions given and understanding voiced 

## 2018-04-28 NOTE — MAU Note (Signed)
Since finding out was preg been having pain RLQ. Since last night have been having sharp pains RLQ that radiate to mid abd. Taken tylenol and does not help. Denies vag bleeding. Some white d/c. Normal BMS and last one this am.

## 2018-04-28 NOTE — MAU Provider Note (Signed)
History     CSN: [id number]  Arrival date and time: 04/28/18 4098   First Provider Initiated Contact with Patient 04/28/18 2129      Chief Complaint  Patient presents with  . Abdominal Pain   HPI Sheila Sexton 22 y.o. [redacted]w[redacted]d  Has an appointment in December at [facility].  Currently is having worsening lower abdominal pain and is worried.  No vaginal bleeding.  Has had some nausea but no vomiting.  Has worseing pain on the right side.  OB History    Gravida  1   Para      Term      Preterm      AB      Living        SAB      TAB      Ectopic      Multiple      Live Births              Past Medical History:  Diagnosis Date  . Ovarian cyst     Past Surgical History:  Procedure Laterality Date  . CYSTOSCOPY    . WISDOM TOOTH EXTRACTION      Family History  Problem Relation Age of Onset  . Depression Mother   . Anxiety disorder Mother   . Healthy Sister   . Cancer Maternal Grandmother   . Healthy Sister     Social History   Tobacco Use  . Smoking status: Never Smoker  . Smokeless tobacco: Never Used  Substance Use Topics  . Alcohol use: No  . Drug use: No    Allergies: No Known Allergies  Medications Prior to Admission  Medication Sig Dispense Refill Last Dose  . escitalopram (LEXAPRO) 20 MG tablet Take 1 tablet (20 mg total) by mouth daily. 90 tablet 0 04/27/2018 at Unknown time  . Prenatal Vit-Fe Fumarate-FA (PRENATAL MULTIVITAMIN) TABS tablet Take 1 tablet by mouth daily at 12 noon.   04/28/2018 at Unknown time  . clonazePAM (KLONOPIN) 0.5 MG tablet Take 0.5 tablets (0.25 mg total) by mouth 2 (two) times daily. 30 tablet 0 More than a month at Unknown time    Review of Systems  Constitutional: Negative for fever.  Gastrointestinal: Positive for abdominal pain and nausea. Negative for vomiting.  Genitourinary: Negative for dysuria, vaginal bleeding and vaginal discharge.   Physical Exam   Blood pressure 140/66, pulse 94,  temperature 98.6 F (37 C), resp. rate 18, height 5' 5.5" (1.664 m), weight 68.9 kg, last menstrual period 03/20/2018.  Physical Exam  Nursing note and vitals reviewed. Constitutional: She is oriented to person, place, and time. She appears well-developed and well-nourished.  HENT:  Head: Normocephalic.  Eyes: EOM are normal.  Neck: Neck supple.  GI: Soft. There is no tenderness. There is no rebound and no guarding.  Genitourinary:  Genitourinary Comments: Speculum exam: Vagina - Small amount of white discharge, no odor Cervix - No contact bleeding Bimanual exam: Cervix closed Uterus non tender, normal size Adnexa non tender, no masses bilaterally GC/Chlam, wet prep done Chaperone present for exam.   Musculoskeletal: Normal range of motion.  Neurological: She is alert and oriented to person, place, and time.  Skin: Skin is warm and dry.  Psychiatric: She has a normal mood and affect.    MAU Course  Procedures CLINICAL DATA:  Abdominal pain.  Pregnant patient.  EXAM: OBSTETRIC <14 WK [place] AND TRANSVAGINAL OB US  TECHNIQUE: Both transabdominal and transvaginal ultrasound examinations were performed  for complete evaluation of the gestation as well as the maternal uterus, adnexal regions, and pelvic cul-de-sac. Transvaginal technique was performed to assess early pregnancy.  COMPARISON:  None.  FINDINGS: Intrauterine gestational sac: Single  Yolk sac:  Visualized.  Embryo:  Visualized.  Cardiac Activity: Visualized.  Heart Rate: 101 bpm  MSD:   mm    w     d  CRL:  2.2 mm   5 w   5 d                  US EDC: December 24, 2018  Subchorionic hemorrhage:  None visualized.  Maternal uterus/adnexae: Corpus luteum cyst in the right ovary. The left ovary is normal.  IMPRESSION: Single live IUP.  No cause for pain identified.  Results for orders placed or performed during the hospital encounter of 04/28/18 (from the past 24 hour(s))  Urinalysis, Routine w  reflex microscopic     Status: Abnormal   Collection Time: 04/28/18  8:11 PM  Result Value Ref Range   Color, Urine STRAW (A) YELLOW   APPearance CLEAR CLEAR   Specific Gravity, Urine 1.003 (L) 1.005 - 1.030   pH 6.0 5.0 - 8.0   Glucose, UA NEGATIVE NEGATIVE mg/dL   Hgb urine dipstick NEGATIVE NEGATIVE   Bilirubin Urine NEGATIVE NEGATIVE   Ketones, ur NEGATIVE NEGATIVE mg/dL   Protein, ur NEGATIVE NEGATIVE mg/dL   Nitrite NEGATIVE NEGATIVE   Leukocytes, UA SMALL (A) NEGATIVE   RBC / HPF 0-5 0 - 5 RBC/hpf   WBC, UA 0-5 0 - 5 WBC/hpf   Bacteria, UA RARE (A) NONE SEEN   Squamous Epithelial / LPF 0-5 0 - 5  Pregnancy, urine POC     Status: Abnormal   Collection Time: 04/28/18  8:16 PM  Result Value Ref Range   Preg Test, Ur POSITIVE (A) NEGATIVE  Wet prep, genital     Status: Abnormal   Collection Time: 04/28/18  9:40 PM  Result Value Ref Range   Yeast Wet Prep HPF POC NONE SEEN NONE SEEN   Trich, Wet Prep NONE SEEN NONE SEEN   Clue Cells Wet Prep HPF POC NONE SEEN NONE SEEN   WBC, Wet Prep HPF POC MANY (A) NONE SEEN   Sperm NONE SEEN   CBC     Status: None   Collection Time: 04/28/18  9:58 PM  Result Value Ref Range   WBC 7.5 4.0 - 10.5 K/uL   RBC 4.28 3.87 - 5.11 MIL/uL   Hemoglobin 13.3 12.0 - 15.0 g/dL   HCT 16.139.5 09.636.0 - 04.546.0 %   MCV 92.3 80.0 - 100.0 fL   MCH 31.1 26.0 - 34.0 pg   MCHC 33.7 30.0 - 36.0 g/dL   RDW 40.912.3 81.111.5 - 91.415.5 %   Platelets 248 150 - 400 K/uL   nRBC 0.0 0.0 - 0.2 %  ABO/Rh     Status: None (Preliminary result)   Collection Time: 04/28/18  9:58 PM  Result Value Ref Range   ABO/RH(D)      O POS Performed at [facility], [address] [place], [place] [id number]       MDM Due to abdominal pain, had to rule out an ectopic pregnancy which could be life threatening.  Client very excited about the ultrasound results today.  Assessment and Plan  Viable IUP with normal ovaries  Plan No evidence of ectopic pregnancy Plan to keep your  appointment in the office as scheduled in December.  Discussed that corpus luteum is likely the cause of her pain on the right side. See AVS for additional info given to client.  Prabhleen Montemayor L Caris Cerveny 04/28/2018, 9:29 PM

## 2018-04-29 LAB — RPR: RPR Ser Ql: NONREACTIVE

## 2018-04-29 LAB — HIV ANTIBODY (ROUTINE TESTING W REFLEX): HIV Screen 4th Generation wRfx: NONREACTIVE

## 2018-04-29 LAB — ABO/RH: ABO/RH(D): O POS

## 2018-04-30 LAB — GC/CHLAMYDIA PROBE AMP (~~LOC~~) NOT AT ARMC
Chlamydia: NEGATIVE
NEISSERIA GONORRHEA: NEGATIVE

## 2018-05-30 NOTE — L&D Delivery Note (Signed)
Patient was C/C/+2 and pushed for <15 minutes with epidural.    NSVD female infant, Apgars 9/9, weight pending.   The patient had 2nd degree laceration repaired with 2-0.  Bilateral labial lacerations repaired with 3-0 and a left sulcal tear repaired with 3-0. Fundus was firm. EBL was expected amount. Placenta was delivered intact. Vagina was clear.  Delayed cord clamping done for 30-60 seconds while warming baby. Baby was vigorous and doing skin to skin with mother.  Sheila Sexton

## 2018-08-23 ENCOUNTER — Inpatient Hospital Stay (HOSPITAL_COMMUNITY)
Admission: AD | Admit: 2018-08-23 | Discharge: 2018-08-23 | Disposition: A | Discharge status: Home / Self Care | Payer: 59 | Attending: Obstetrics | Admitting: Obstetrics

## 2018-08-23 ENCOUNTER — Other Ambulatory Visit: Payer: Self-pay

## 2018-08-23 ENCOUNTER — Encounter (HOSPITAL_COMMUNITY): Payer: Self-pay

## 2018-08-23 DIAGNOSIS — Z3A22 22 weeks gestation of pregnancy: Secondary | ICD-10-CM | POA: Diagnosis not present

## 2018-08-23 DIAGNOSIS — Z79899 Other long term (current) drug therapy: Secondary | ICD-10-CM | POA: Diagnosis not present

## 2018-08-23 DIAGNOSIS — B9689 Other specified bacterial agents as the cause of diseases classified elsewhere: Secondary | ICD-10-CM | POA: Diagnosis not present

## 2018-08-23 DIAGNOSIS — O23592 Infection of other part of genital tract in pregnancy, second trimester: Secondary | ICD-10-CM

## 2018-08-23 DIAGNOSIS — N760 Acute vaginitis: Secondary | ICD-10-CM

## 2018-08-23 DIAGNOSIS — R61 Generalized hyperhidrosis: Secondary | ICD-10-CM | POA: Diagnosis not present

## 2018-08-23 DIAGNOSIS — Z3492 Encounter for supervision of normal pregnancy, unspecified, second trimester: Secondary | ICD-10-CM

## 2018-08-23 LAB — WET PREP, GENITAL
Sperm: NONE SEEN
TRICH WET PREP: NONE SEEN
Yeast Wet Prep HPF POC: NONE SEEN

## 2018-08-23 LAB — URINALYSIS, ROUTINE W REFLEX MICROSCOPIC
Bilirubin Urine: NEGATIVE
Glucose, UA: NEGATIVE mg/dL
Hgb urine dipstick: NEGATIVE
Ketones, ur: NEGATIVE mg/dL
Nitrite: NEGATIVE
Protein, ur: NEGATIVE mg/dL
Specific Gravity, Urine: 1.008 (ref 1.005–1.030)
pH: 5 (ref 5.0–8.0)

## 2018-08-23 LAB — OB RESULTS CONSOLE GBS: GBS: POSITIVE

## 2018-08-23 MED ORDER — METRONIDAZOLE 500 MG PO TABS: 500.0000 mg | ORAL_TABLET | Freq: Two times a day (BID) | ORAL | 0 refills | Status: DC

## 2018-08-23 MED ORDER — ACIDOPHILUS LACTOBACILLUS PO CAPS: 1.0000 | ORAL_CAPSULE | Freq: Every day | ORAL | 0 refills | Status: DC | PRN

## 2018-08-23 NOTE — MAU Note (Signed)
Patient states she has felt wet since last night.  No large gush of fluid but states she feels like she's voided on herself all the time.  No VB.  + FM.  Not feeling any ctx.  No complications.

## 2018-08-23 NOTE — Discharge Instructions (Signed)
Bacterial Vaginosis    Bacterial vaginosis is a vaginal infection that occurs when the normal balance of bacteria in the vagina is disrupted. It results from an overgrowth of certain bacteria. This is the most common vaginal infection among women ages 15-44.  Because bacterial vaginosis increases your risk for STIs (sexually transmitted infections), getting treated can help reduce your risk for chlamydia, gonorrhea, herpes, and HIV (human immunodeficiency virus). Treatment is also important for preventing complications in pregnant women, because this condition can cause an early (premature) delivery.  What are the causes?  This condition is caused by an increase in harmful bacteria that are normally present in small amounts in the vagina. However, the reason that the condition develops is not fully understood.  What increases the risk?  The following factors may make you more likely to develop this condition:  · Having a new sexual partner or multiple sexual partners.  · Having unprotected sex.  · Douching.  · Having an intrauterine device (IUD).  · Smoking.  · Drug and alcohol abuse.  · Taking certain antibiotic medicines.  · Being pregnant.  You cannot get bacterial vaginosis from toilet seats, bedding, swimming pools, or contact with objects around you.  What are the signs or symptoms?  Symptoms of this condition include:  · Grey or white vaginal discharge. The discharge can also be watery or foamy.  · A fish-like odor with discharge, especially after sexual intercourse or during menstruation.  · Itching in and around the vagina.  · Burning or pain with urination.  Some women with bacterial vaginosis have no signs or symptoms.  How is this diagnosed?  This condition is diagnosed based on:  · Your medical history.  · A physical exam of the vagina.  · Testing a sample of vaginal fluid under a microscope to look for a large amount of bad bacteria or abnormal cells. Your health care provider may use a cotton swab or  a small wooden spatula to collect the sample.  How is this treated?  This condition is treated with antibiotics. These may be given as a pill, a vaginal cream, or a medicine that is put into the vagina (suppository). If the condition comes back after treatment, a second round of antibiotics may be needed.  Follow these instructions at home:  Medicines  · Take over-the-counter and prescription medicines only as told by your health care provider.  · Take or use your antibiotic as told by your health care provider. Do not stop taking or using the antibiotic even if you start to feel better.  General instructions  · If you have a female sexual partner, tell her that you have a vaginal infection. She should see her health care provider and be treated if she has symptoms. If you have a female sexual partner, he does not need treatment.  · During treatment:  ? Avoid sexual activity until you finish treatment.  ? Do not douche.  ? Avoid alcohol as directed by your health care provider.  ? Avoid breastfeeding as directed by your health care provider.  · Drink enough water and fluids to keep your urine clear or pale yellow.  · Keep the area around your vagina and rectum clean.  ? Wash the area daily with warm water.  ? Wipe yourself from front to back after using the toilet.  · Keep all follow-up visits as told by your health care provider. This is important.  How is this prevented?  · Do not   douche.  · Wash the outside of your vagina with warm water only.  · Use protection when having sex. This includes latex condoms and dental dams.  · Limit how many sexual partners you have. To help prevent bacterial vaginosis, it is best to have sex with just one partner (monogamous).  · Make sure you and your sexual partner are tested for STIs.  · Wear cotton or cotton-lined underwear.  · Avoid wearing tight pants and pantyhose, especially during summer.  · Limit the amount of alcohol that you drink.  · Do not use any products that contain  nicotine or tobacco, such as cigarettes and e-cigarettes. If you need help quitting, ask your health care provider.  · Do not use illegal drugs.  Where to find more information  · Centers for Disease Control and Prevention: www.cdc.gov/std  · American Sexual Health Association (ASHA): www.ashastd.org  · U.S. Department of Health and Human Services, Office on Women's Health: www.womenshealth.gov/ or https://www.womenshealth.gov/a-z-topics/bacterial-vaginosis  Contact a health care provider if:  · Your symptoms do not improve, even after treatment.  · You have more discharge or pain when urinating.  · You have a fever.  · You have pain in your abdomen.  · You have pain during sex.  · You have vaginal bleeding between periods.  Summary  · Bacterial vaginosis is a vaginal infection that occurs when the normal balance of bacteria in the vagina is disrupted.  · Because bacterial vaginosis increases your risk for STIs (sexually transmitted infections), getting treated can help reduce your risk for chlamydia, gonorrhea, herpes, and HIV (human immunodeficiency virus). Treatment is also important for preventing complications in pregnant women, because the condition can cause an early (premature) delivery.  · This condition is treated with antibiotic medicines. These may be given as a pill, a vaginal cream, or a medicine that is put into the vagina (suppository).  This information is not intended to replace advice given to you by your health care provider. Make sure you discuss any questions you have with your health care provider.  Document Released: 05/16/2005 Document Revised: 09/19/2016 Document Reviewed: 01/30/2016  Elsevier Interactive Patient Education © 2019 [employer]vier Inc.

## 2018-08-23 NOTE — MAU Provider Note (Signed)
History     CSN: [id number]  Arrival date and time: 08/23/18 2047   First Provider Initiated Contact with Patient 08/23/18 2110      Chief Complaint  Patient presents with  . Vaginal Discharge   HPI Sheila Sexton is a 23 y.o. G1P0 at [redacted]w[redacted]d who presents to MAU with chief concern for SROM. She states she has been battling night sweats for a couple weeks but has new onset wetness on her pants and underwear throughout the day. She states it "looks like pee on my underwear" but she is concerned that she continues to leak and does not seem to have control over it.  Patient denies vaginal bleeding, abdominal tenderness or cramping, fever, falls, or recent illness.    OB History    Gravida  1   Para      Term      Preterm      AB      Living        SAB      TAB      Ectopic      Multiple      Live Births              Past Medical History:  Diagnosis Date  . Ovarian cyst     Past Surgical History:  Procedure Laterality Date  . CYSTOSCOPY    . WISDOM TOOTH EXTRACTION      Family History  Problem Relation Age of Onset  . Depression Mother   . Anxiety disorder Mother   . Healthy Sister   . Cancer Maternal Grandmother   . Healthy Sister     Social History   Tobacco Use  . Smoking status: Never Smoker  . Smokeless tobacco: Never Used  Substance Use Topics  . Alcohol use: No  . Drug use: No    Allergies: No Known Allergies  Medications Prior to Admission  Medication Sig Dispense Refill Last Dose  . escitalopram (LEXAPRO) 20 MG tablet Take 1 tablet (20 mg total) by mouth daily. 90 tablet 0 08/23/2018 at Unknown time  . Prenatal Vit-Fe Fumarate-FA (PRENATAL MULTIVITAMIN) TABS tablet Take 1 tablet by mouth daily at 12 noon.   08/22/2018 at Unknown time    Review of Systems  Constitutional: Negative for chills, fatigue and fever.  Respiratory: Negative for shortness of breath.   Gastrointestinal: Negative for abdominal pain.  Genitourinary: Positive  for vaginal discharge. Negative for difficulty urinating, dysuria and flank pain.  Musculoskeletal: Negative for back pain.  Neurological: Negative for headaches.  All other systems reviewed and are negative.  Physical Exam   Blood pressure 128/60, pulse 95, temperature 98.3 F (36.8 C), resp. rate 17, height 5\' 5"  (1.651 m), weight 75.9 kg, last menstrual period 03/20/2018, SpO2 99 %.  Physical Exam  Nursing note and vitals reviewed. Constitutional: She is oriented to person, place, and time. She appears well-developed and well-nourished.  Cardiovascular: Normal rate.  Respiratory: Effort normal.  GI: Soft. She exhibits no distension. There is no abdominal tenderness. There is no rebound and no guarding.  Genitourinary:    Genitourinary Comments: Thick greenish white clusters of discharge visible at introitus and throughout vaginal vault.    Musculoskeletal: Normal range of motion.  Neurological: She is alert and oriented to person, place, and time.  Skin: Skin is warm and dry.  Psychiatric: She has a normal mood and affect. Her behavior is normal. Judgment and thought content normal.    MAU Course  Procedures: sterile  speculum exam  --Abnormal UA, concern for UTI. Discussed prescribing Keflex now vs. Waiting for results of urine culture. Patient prefers to wait for culture.  Patient Vitals for the past 24 hrs:  BP Temp Pulse Resp SpO2 Height Weight  08/23/18 2105 128/60 98.3 F (36.8 C) 95 17 99 % - -  08/23/18 2059 - - - - - 5\' 5"  (1.651 m) 75.9 kg   Results for orders placed or performed during the hospital encounter of 08/23/18 (from the past 24 hour(s))  Urinalysis, Routine w reflex microscopic     Status: Abnormal   Collection Time: 08/23/18  8:55 PM  Result Value Ref Range   Color, Urine STRAW (A) YELLOW   APPearance CLEAR CLEAR   Specific Gravity, Urine 1.008 1.005 - 1.030   pH 5.0 5.0 - 8.0   Glucose, UA NEGATIVE NEGATIVE mg/dL   Hgb urine dipstick NEGATIVE  NEGATIVE   Bilirubin Urine NEGATIVE NEGATIVE   Ketones, ur NEGATIVE NEGATIVE mg/dL   Protein, ur NEGATIVE NEGATIVE mg/dL   Nitrite NEGATIVE NEGATIVE   Leukocytes,Ua LARGE (A) NEGATIVE   RBC / HPF 6-10 0 - 5 RBC/hpf   WBC, UA 11-20 0 - 5 WBC/hpf   Bacteria, UA RARE (A) NONE SEEN   Squamous Epithelial / LPF 6-10 0 - 5   Mucus PRESENT   Wet prep, genital     Status: Abnormal   Collection Time: 08/23/18  9:18 PM  Result Value Ref Range   Yeast Wet Prep HPF POC NONE SEEN NONE SEEN   Trich, Wet Prep NONE SEEN NONE SEEN   Clue Cells Wet Prep HPF POC PRESENT (A) NONE SEEN   WBC, Wet Prep HPF POC MANY (A) NONE SEEN   Sperm NONE SEEN     Meds ordered this encounter  Medications  . metroNIDAZOLE (FLAGYL) 500 MG tablet    Sig: Take 1 tablet (500 mg total) by mouth 2 (two) times daily.    Dispense:  14 tablet    Refill:  0    Order Specific Question:   Supervising Provider    Answer:   Reva Bores [[id number]]  . Acidophilus Lactobacillus CAPS    Sig: Take 1 tablet by mouth daily as needed.    Dispense:  30 capsule    Refill:  0    Order Specific Question:   Supervising Provider    Answer:   Reva Bores [[id number]]      Assessment and Plan  --23 y.o. G1P0 at [redacted]w[redacted]d  --FHT 150 by Doppler --Bacterial vaginosis, rx to patient pharmacy --Urine culture pending --Discharge home in stable condition  Clayton Bibles, [place] 08/23/18  11:05 PM

## 2018-08-24 LAB — GC/CHLAMYDIA PROBE AMP (~~LOC~~) NOT AT ARMC
CHLAMYDIA, DNA PROBE: NEGATIVE
Neisseria Gonorrhea: NEGATIVE

## 2018-08-25 LAB — CULTURE, OB URINE: Culture: 10000 — AB

## 2018-08-26 ENCOUNTER — Telehealth: Payer: Self-pay | Admitting: Certified Nurse Midwife

## 2018-08-26 MED ORDER — AMOXICILLIN 500 MG PO CAPS: 500.0000 mg | ORAL_CAPSULE | Freq: Three times a day (TID) | ORAL | 0 refills | Status: DC

## 2018-08-26 NOTE — Telephone Encounter (Signed)
Notified of +GBS UTI. Rx sent.

## 2018-11-13 ENCOUNTER — Inpatient Hospital Stay (HOSPITAL_COMMUNITY)
Admission: AD | Admit: 2018-11-13 | Discharge: 2018-11-13 | Disposition: A | Discharge status: Home / Self Care | Payer: 59 | Attending: Obstetrics and Gynecology | Admitting: Obstetrics and Gynecology

## 2018-11-13 ENCOUNTER — Other Ambulatory Visit: Payer: Self-pay

## 2018-11-13 ENCOUNTER — Encounter (HOSPITAL_COMMUNITY): Payer: Self-pay

## 2018-11-13 DIAGNOSIS — O479 False labor, unspecified: Secondary | ICD-10-CM

## 2018-11-13 DIAGNOSIS — O4703 False labor before 37 completed weeks of gestation, third trimester: Secondary | ICD-10-CM | POA: Diagnosis present

## 2018-11-13 DIAGNOSIS — B373 Candidiasis of vulva and vagina: Secondary | ICD-10-CM | POA: Diagnosis not present

## 2018-11-13 DIAGNOSIS — Z3A34 34 weeks gestation of pregnancy: Secondary | ICD-10-CM

## 2018-11-13 DIAGNOSIS — O98813 Other maternal infectious and parasitic diseases complicating pregnancy, third trimester: Secondary | ICD-10-CM | POA: Insufficient documentation

## 2018-11-13 DIAGNOSIS — B3731 Acute candidiasis of vulva and vagina: Secondary | ICD-10-CM

## 2018-11-13 DIAGNOSIS — O133 Gestational [pregnancy-induced] hypertension without significant proteinuria, third trimester: Secondary | ICD-10-CM | POA: Diagnosis not present

## 2018-11-13 LAB — CBC
HCT: 30.6 % — ABNORMAL LOW (ref 36.0–46.0)
Hemoglobin: 10.1 g/dL — ABNORMAL LOW (ref 12.0–15.0)
MCH: 29.8 pg (ref 26.0–34.0)
MCHC: 33 g/dL (ref 30.0–36.0)
MCV: 90.3 fL (ref 80.0–100.0)
Platelets: 173 10*3/uL (ref 150–400)
RBC: 3.39 MIL/uL — ABNORMAL LOW (ref 3.87–5.11)
RDW: 13.1 % (ref 11.5–15.5)
WBC: 8.5 10*3/uL (ref 4.0–10.5)
nRBC: 0 % (ref 0.0–0.2)

## 2018-11-13 LAB — WET PREP, GENITAL
Clue Cells Wet Prep HPF POC: NONE SEEN
Sperm: NONE SEEN
Trich, Wet Prep: NONE SEEN

## 2018-11-13 LAB — COMPREHENSIVE METABOLIC PANEL
ALT: 12 U/L (ref 0–44)
AST: 16 U/L (ref 15–41)
Albumin: 2.6 g/dL — ABNORMAL LOW (ref 3.5–5.0)
Alkaline Phosphatase: 131 U/L — ABNORMAL HIGH (ref 38–126)
Anion gap: 7 (ref 5–15)
BUN: <5 mg/dL — ABNORMAL LOW (ref 6–20)
CO2: 21 mmol/L — ABNORMAL LOW (ref 22–32)
Calcium: 8.9 mg/dL (ref 8.9–10.3)
Chloride: 109 mmol/L (ref 98–111)
Creatinine, Ser: 0.49 mg/dL (ref 0.44–1.00)
GFR calc Af Amer: >60 mL/min (ref >60)
GFR calc non Af Amer: >60 mL/min (ref >60)
Glucose, Bld: 115 mg/dL — ABNORMAL HIGH (ref 70–99)
Potassium: 3.5 mmol/L (ref 3.5–5.1)
Sodium: 137 mmol/L (ref 135–145)
Total Bilirubin: 0.3 mg/dL (ref 0.3–1.2)
Total Protein: 5.5 g/dL — ABNORMAL LOW (ref 6.5–8.1)

## 2018-11-13 LAB — URINALYSIS, ROUTINE W REFLEX MICROSCOPIC
Bilirubin Urine: NEGATIVE
Glucose, UA: NEGATIVE mg/dL
Hgb urine dipstick: NEGATIVE
Ketones, ur: NEGATIVE mg/dL
Nitrite: NEGATIVE
Protein, ur: NEGATIVE mg/dL
Specific Gravity, Urine: 1.003 — ABNORMAL LOW (ref 1.005–1.030)
pH: 7 (ref 5.0–8.0)

## 2018-11-13 LAB — PROTEIN / CREATININE RATIO, URINE
Creatinine, Urine: 15.67 mg/dL
Total Protein, Urine: <6 mg/dL

## 2018-11-13 MED ORDER — TERCONAZOLE 0.4 % VA CREA: 1.0000 | TOPICAL_CREAM | Freq: Every day | VAGINAL | 0 refills | Status: DC

## 2018-11-13 NOTE — MAU Provider Note (Addendum)
Chief Complaint  Patient presents with  . Abdominal Pain     First Provider Initiated Contact with Patient 11/13/18 1358      S: Sheila Sexton  is a 23 y.o. y.o. year old G87P0 female at [redacted]w[redacted]d weeks gestation who presents to MAU reporting increased Braxton Hicks contractions and incidentally found to have elevated blood pressure. No Hx HTN. Current blood pressure medication: None  Associated symptoms: Pos for mild Headache this morning that resolved spontaneously and intermittent epigastric pain (None now).  Neg for vision changes.  Contractions: Irreg, mild Vaginal bleeding: Denies Fetal movement: Nml  O:  Patient Vitals for the past 24 hrs:  BP Temp Temp src Pulse Resp  11/13/18 1546 127/62 - - 91 -  11/13/18 1531 133/68 - - 100 -  11/13/18 1516 130/75 - - (!) 104 -  11/13/18 1501 121/60 - - 98 -  11/13/18 1446 134/73 - - 96 -  11/13/18 1431 136/69 - - 92 -  11/13/18 1416 130/76 - - 96 -  11/13/18 1346 139/73 - - 96 -  11/13/18 1332 (!) 147/80 98.5 F (36.9 C) Oral (!) 108 18   General: NAD Heart: Regular rate Lungs: Normal rate and effort Abd: Soft, NT, Gravid, S=D Extremities: Tr Pedal edema Neuro: 2+ deep tendon reflexes, No clonus Pelvic: NEFG, no bleeding or LOF.   Effacement (%): Thick Cervical Position: Middle Station: Ballotable Presentation: Undeterminable Exam by:: Manya Silvas, CNM  EFM: 150, Moderate variability, 15 x 15 accelerations, no decelerations Toco: Q3-8, mild  Results for orders placed or performed during the hospital encounter of 11/13/18 (from the past 24 hour(s))  Urinalysis, Routine w reflex microscopic     Status: Abnormal   Collection Time: 11/13/18  1:23 PM  Result Value Ref Range   Color, Urine STRAW (A) YELLOW   APPearance CLEAR CLEAR   Specific Gravity, Urine 1.003 (L) 1.005 - 1.030   pH 7.0 5.0 - 8.0   Glucose, UA NEGATIVE NEGATIVE mg/dL   Hgb urine dipstick NEGATIVE NEGATIVE   Bilirubin Urine NEGATIVE NEGATIVE   Ketones, ur  NEGATIVE NEGATIVE mg/dL   Protein, ur NEGATIVE NEGATIVE mg/dL   Nitrite NEGATIVE NEGATIVE   Leukocytes,Ua MODERATE (A) NEGATIVE   RBC / HPF 0-5 0 - 5 RBC/hpf   WBC, UA 0-5 0 - 5 WBC/hpf   Bacteria, UA RARE (A) NONE SEEN   Squamous Epithelial / LPF 0-5 0 - 5  Protein / creatinine ratio, urine     Status: None   Collection Time: 11/13/18  1:23 PM  Result Value Ref Range   Creatinine, Urine 15.67 mg/dL   Total Protein, Urine <6.0 mg/dL   Protein Creatinine Ratio        0.00 - 0.15 mg/mg[Cre]  Wet prep, genital     Status: Abnormal   Collection Time: 11/13/18  2:05 PM   Specimen: Vaginal; Genital  Result Value Ref Range   Yeast Wet Prep HPF POC PRESENT (A) NONE SEEN   Trich, Wet Prep NONE SEEN NONE SEEN   Clue Cells Wet Prep HPF POC NONE SEEN NONE SEEN   WBC, Wet Prep HPF POC FEW (A) NONE SEEN   Sperm NONE SEEN   CBC     Status: Abnormal   Collection Time: 11/13/18  2:22 PM  Result Value Ref Range   WBC 8.5 4.0 - 10.5 K/uL   RBC 3.39 (L) 3.87 - 5.11 MIL/uL   Hemoglobin 10.1 (L) 12.0 - 15.0 g/dL   HCT 30.6 (L)  36.0 - 46.0 %   MCV 90.3 80.0 - 100.0 fL   MCH 29.8 26.0 - 34.0 pg   MCHC 33.0 30.0 - 36.0 g/dL   RDW 41.313.1 24.411.5 - 01.015.5 %   Platelets 173 150 - 400 K/uL   nRBC 0.0 0.0 - 0.2 %  Comprehensive metabolic panel     Status: Abnormal   Collection Time: 11/13/18  2:22 PM  Result Value Ref Range   Sodium 137 135 - 145 mmol/L   Potassium 3.5 3.5 - 5.1 mmol/L   Chloride 109 98 - 111 mmol/L   CO2 21 (L) 22 - 32 mmol/L   Glucose, Bld 115 (H) 70 - 99 mg/dL   BUN <5 (L) 6 - 20 mg/dL   Creatinine, Ser 2.720.49 0.44 - 1.00 mg/dL   Calcium 8.9 8.9 - 53.610.3 mg/dL   Total Protein 5.5 (L) 6.5 - 8.1 g/dL   Albumin 2.6 (L) 3.5 - 5.0 g/dL   AST 16 15 - 41 U/L   ALT 12 0 - 44 U/L   Alkaline Phosphatase 131 (H) 38 - 126 U/L   Total Bilirubin 0.3 0.3 - 1.2 mg/dL   GFR calc non Af Amer >60 >60 mL/min   GFR calc Af Amer >60 >60 mL/min   Anion gap 7 5 - 15    MAU Course/MDM Orders Placed This  Encounter  Procedures  . Wet prep, genital  . Urinalysis, Routine w reflex microscopic  . CBC  . Comprehensive metabolic panel  . Protein / creatinine ratio, urine  . Discharge patient   - Braxton Hicks contractions.  Cervix closed and long.  No evidence of active preterm labor. - Transient hypertension.  Normal preeclampsia labs and no symptoms of preeclampsia.  Precautions reviewed.  Blood pressure check in 3 days. - Vaginal yeast infection.  Rx Terazol.  A: 6387w0d week IUP 1. Braxton Hicks contractions   2. Transient hypertension of pregnancy in third trimester   3. Vaginal yeast infection   FHR reactive  P: Discharge home in stable condition  Preeclampsia precautions. Follow-up for blood pressure check in 3 days as scheduled at your doctor's office sooner as needed if symptoms worsen. Return to maternity admissions as needed in emergencies  [place], [place], [place] 11/13/2018 3:48 PM

## 2018-11-13 NOTE — Discharge Instructions (Signed)
Braxton Hicks Contractions °Contractions of the uterus can occur throughout pregnancy, but they are not always a sign that you are in labor. You may have practice contractions called Braxton Hicks contractions. These false labor contractions are sometimes confused with true labor. °What are Braxton Hicks contractions? °Braxton Hicks contractions are tightening movements that occur in the muscles of the uterus before labor. Unlike true labor contractions, these contractions do not result in opening (dilation) and thinning of the cervix. Toward the end of pregnancy (32-34 weeks), Braxton Hicks contractions can happen more often and may become stronger. These contractions are sometimes difficult to tell apart from true labor because they can be very uncomfortable. You should not feel embarrassed if you go to the hospital with false labor. °Sometimes, the only way to tell if you are in true labor is for your health care provider to look for changes in the cervix. The health care provider will do a physical exam and may monitor your contractions. If you are not in true labor, the exam should show that your cervix is not dilating and your water has not broken. °If there are no other health problems associated with your pregnancy, it is completely safe for you to be sent home with false labor. You may continue to have Braxton Hicks contractions until you go into true labor. °How to tell the difference between true labor and false labor °True labor °· Contractions last 30-70 seconds. °· Contractions become very regular. °· Discomfort is usually felt in the top of the uterus, and it spreads to the lower abdomen and low back. °· Contractions do not go away with walking. °· Contractions usually become more intense and increase in frequency. °· The cervix dilates and gets thinner. °False labor °· Contractions are usually shorter and not as strong as true labor contractions. °· Contractions are usually irregular. °· Contractions  are often felt in the front of the lower abdomen and in the groin. °· Contractions may go away when you walk around or change positions while lying down. °· Contractions get weaker and are shorter-lasting as time goes on. °· The cervix usually does not dilate or become thin. °Follow these instructions at home: ° °· Take over-the-counter and prescription medicines only as told by your health care provider. °· Keep up with your usual exercises and follow other instructions from your health care provider. °· Eat and drink lightly if you think you are going into labor. °· If Braxton Hicks contractions are making you uncomfortable: °? Change your position from lying down or resting to walking, or change from walking to resting. °? Sit and rest in a tub of warm water. °? Drink enough fluid to keep your urine pale yellow. Dehydration may cause these contractions. °? Do slow and deep breathing several times an hour. °· Keep all follow-up prenatal visits as told by your health care provider. This is important. °Contact a health care provider if: °· You have a fever. °· You have continuous pain in your abdomen. °Get help right away if: °· Your contractions become stronger, more regular, and closer together. °· You have fluid leaking or gushing from your vagina. °· You pass blood-tinged mucus (bloody show). °· You have bleeding from your vagina. °· You have low back pain that you never had before. °· You feel your baby’s head pushing down and causing pelvic pressure. °· Your baby is not moving inside you as much as it used to. °Summary °· Contractions that occur before labor are   called Braxton Hicks contractions, false labor, or practice contractions.  Braxton Hicks contractions are usually shorter, weaker, farther apart, and less regular than true labor contractions. True labor contractions usually become progressively stronger and regular, and they become more frequent.  Manage discomfort from [facility] contractions  by changing position, resting in a warm bath, drinking plenty of water, or practicing deep breathing. This information is not intended to replace advice given to you by your health care provider. Make sure you discuss any questions you have with your health care provider. Document Released: 09/29/2016 Document Revised: 02/28/2017 Document Reviewed: 09/29/2016 Elsevier Interactive Patient Education  2019 [employer].     Hypertension During Pregnancy  Hypertension, commonly called high blood pressure, is when the force of blood pumping through your arteries is too strong. Arteries are blood vessels that carry blood from the heart throughout the body. Hypertension during pregnancy can cause problems for you and your baby. Your baby may be born early (prematurely) or may not weigh as much as he or she should at birth. Very bad cases of hypertension during pregnancy can be life-threatening. Different types of hypertension can occur during pregnancy. These include:  Chronic hypertension. This happens when: ? You have hypertension before pregnancy and it continues during pregnancy. ? You develop hypertension before you are [redacted] weeks pregnant, and it continues during pregnancy.  Gestational hypertension. This is hypertension that develops after the 20th week of pregnancy.  Preeclampsia, also called toxemia of pregnancy. This is a very serious type of hypertension that develops during pregnancy. It can be very dangerous for you and your baby. ? In rare cases, you may develop preeclampsia after giving birth (postpartum preeclampsia). This usually occurs within 48 hours after childbirth but may occur up to 6 weeks after giving birth. Gestational hypertension and preeclampsia usually go away within 6 weeks after your baby is born. Women who have hypertension during pregnancy have a greater chance of developing hypertension later in life or during future pregnancies. What are the causes? The exact cause  of hypertension during pregnancy is not known. What increases the risk? There are certain factors that make it more likely for you to develop hypertension during pregnancy. These include:  Having hypertension during a previous pregnancy or prior to pregnancy.  Being overweight.  Being age 61 or older.  Being pregnant for the first time.  Being pregnant with more than one baby.  Becoming pregnant using fertilization methods such as IVF (in vitro fertilization).  Having diabetes, kidney problems, or systemic lupus erythematosus.  Having a family history of hypertension. What are the signs or symptoms? Chronic hypertension and gestational hypertension rarely cause symptoms. Preeclampsia causes symptoms, which may include:  Increased protein in your urine. Your health care provider will check for this at every visit before you give birth (prenatal visit).  Severe headaches.  Sudden weight gain.  Swelling of the hands, face, legs, and feet.  Nausea and vomiting.  Vision problems, such as blurred or double vision.  Numbness in the face, arms, legs, and feet.  Dizziness.  Slurred speech.  Sensitivity to bright lights.  Abdominal pain.  Convulsions or seizures. How is this diagnosed? You may be diagnosed with hypertension during a routine prenatal exam. At each prenatal visit, you may:  Have a urine test to check for high amounts of protein in your urine.  Have your blood pressure checked. A blood pressure reading is given as two numbers, such as "120 over 80" (or 120/80). The first ("  top") number is a measure of the pressure in your arteries when your heart beats (systolic pressure). The second ("bottom") number is a measure of the pressure in your arteries as your heart relaxes between beats (diastolic pressure). Blood pressure is measured in a unit called mm Hg. For most women, a normal blood pressure reading is: ? Systolic: below 120. ? Diastolic: below 80. The type  of hypertension that you are diagnosed with depends on your test results and when your symptoms developed.  Chronic hypertension is usually diagnosed before 20 weeks of pregnancy.  Gestational hypertension is usually diagnosed after 20 weeks of pregnancy.  Hypertension with high amounts of protein in the urine is diagnosed as preeclampsia.  Blood pressure measurements that stay above 160 systolic, or above 110 diastolic, are signs of severe preeclampsia. How is this treated? Treatment for hypertension during pregnancy varies depending on the type of hypertension you have and how serious it is.  If you take medicines called ACE inhibitors to treat chronic hypertension, you may need to switch medicines. ACE inhibitors should not be taken during pregnancy.  If you have gestational hypertension, you may need to take blood pressure medicine.  If you are at risk for preeclampsia, your health care provider may recommend that you take a low-dose aspirin during your pregnancy.  If you have severe preeclampsia, you may need to be hospitalized so you and your baby can be monitored closely. You may also need to take medicine (magnesium sulfate) to prevent seizures and to lower blood pressure. This medicine may be given as an injection or through an IV.  In some cases, if your condition gets worse, you may need to deliver your baby early. Follow these instructions at home: Eating and drinking   Drink enough fluid to keep your urine pale yellow.  Avoid caffeine. Lifestyle  Do not use any products that contain nicotine or tobacco, such as cigarettes and e-cigarettes. If you need help quitting, ask your health care provider.  Do not use alcohol or drugs.  Avoid stress as much as possible. Rest and get plenty of sleep. General instructions  Take over-the-counter and prescription medicines only as told by your health care provider.  While lying down, lie on your left side. This keeps pressure  off your major blood vessels.  While sitting or lying down, raise (elevate) your feet. Try putting some pillows under your lower legs.  Exercise regularly. Ask your health care provider what kinds of exercise are best for you.  Keep all prenatal and follow-up visits as told by your health care provider. This is important. Contact a health care provider if:  You have symptoms that your health care provider told you may require more treatment or monitoring, such as: ? Nausea or vomiting. ? Headache. Get help right away if you have:  Severe abdominal pain that does not get better with treatment.  A severe headache that does not get better.  Vomiting that does not get better.  Sudden, rapid weight gain.  Sudden swelling in your hands, ankles, or face.  Vaginal bleeding.  Blood in your urine.  Fewer movements from your baby than usual.  Blurred or double vision.  Muscle twitching or sudden muscle tightening (spasms).  Shortness of breath.  Blue fingernails or lips. Summary  Hypertension, commonly called high blood pressure, is when the force of blood pumping through your arteries is too strong.  Hypertension during pregnancy can cause problems for you and your baby.  Treatment for hypertension  during pregnancy varies depending on the type of hypertension you have and how serious it is.  Get help right away if you have symptoms that your health care provider told you to watch for. This information is not intended to replace advice given to you by your health care provider. Make sure you discuss any questions you have with your health care provider. Document Released: 02/01/2011 Document Revised: 05/02/2017 Document Reviewed: 10/30/2015 Elsevier Interactive Patient Education  2019 [employer]

## 2018-11-13 NOTE — MAU Note (Signed)
Pt presents to MAU with c/o lower abdominal cramping that she thinks are braxton hicks, started around 0330 and she has continued to feel pain. Pt denies VB and LOF. +FM

## 2018-12-14 ENCOUNTER — Inpatient Hospital Stay (HOSPITAL_COMMUNITY)
Admission: AD | Admit: 2018-12-14 | Discharge: 2018-12-16 | DRG: 807 | Disposition: A | Discharge status: Home / Self Care | Payer: 59 | Attending: Obstetrics and Gynecology | Admitting: Obstetrics and Gynecology

## 2018-12-14 ENCOUNTER — Encounter (HOSPITAL_COMMUNITY): Payer: Self-pay

## 2018-12-14 ENCOUNTER — Inpatient Hospital Stay (HOSPITAL_COMMUNITY): Payer: 59 | Admitting: Anesthesiology

## 2018-12-14 ENCOUNTER — Other Ambulatory Visit: Payer: Self-pay

## 2018-12-14 DIAGNOSIS — Z3A38 38 weeks gestation of pregnancy: Secondary | ICD-10-CM

## 2018-12-14 DIAGNOSIS — O99824 Streptococcus B carrier state complicating childbirth: Secondary | ICD-10-CM | POA: Diagnosis present

## 2018-12-14 DIAGNOSIS — D649 Anemia, unspecified: Secondary | ICD-10-CM | POA: Diagnosis present

## 2018-12-14 DIAGNOSIS — B951 Streptococcus, group B, as the cause of diseases classified elsewhere: Secondary | ICD-10-CM | POA: Diagnosis not present

## 2018-12-14 DIAGNOSIS — R030 Elevated blood-pressure reading, without diagnosis of hypertension: Secondary | ICD-10-CM | POA: Diagnosis not present

## 2018-12-14 DIAGNOSIS — Z1159 Encounter for screening for other viral diseases: Secondary | ICD-10-CM | POA: Diagnosis not present

## 2018-12-14 DIAGNOSIS — O134 Gestational [pregnancy-induced] hypertension without significant proteinuria, complicating childbirth: Principal | ICD-10-CM | POA: Diagnosis present

## 2018-12-14 DIAGNOSIS — O1493 Unspecified pre-eclampsia, third trimester: Secondary | ICD-10-CM

## 2018-12-14 DIAGNOSIS — O98813 Other maternal infectious and parasitic diseases complicating pregnancy, third trimester: Secondary | ICD-10-CM

## 2018-12-14 DIAGNOSIS — O9902 Anemia complicating childbirth: Secondary | ICD-10-CM | POA: Diagnosis present

## 2018-12-14 HISTORY — DX: Unspecified pre-eclampsia, third trimester: O14.93

## 2018-12-14 LAB — CBC
HCT: 33.7 % — ABNORMAL LOW (ref 36.0–46.0)
Hemoglobin: 10.9 g/dL — ABNORMAL LOW (ref 12.0–15.0)
MCH: 28.7 pg (ref 26.0–34.0)
MCHC: 32.3 g/dL (ref 30.0–36.0)
MCV: 88.7 fL (ref 80.0–100.0)
Platelets: 163 10*3/uL (ref 150–400)
RBC: 3.8 MIL/uL — ABNORMAL LOW (ref 3.87–5.11)
RDW: 13.8 % (ref 11.5–15.5)
WBC: 9.6 10*3/uL (ref 4.0–10.5)
nRBC: 0 % (ref 0.0–0.2)

## 2018-12-14 LAB — COMPREHENSIVE METABOLIC PANEL
ALT: 13 U/L (ref 0–44)
AST: 23 U/L (ref 15–41)
Albumin: 2.9 g/dL — ABNORMAL LOW (ref 3.5–5.0)
Alkaline Phosphatase: 193 U/L — ABNORMAL HIGH (ref 38–126)
Anion gap: 10 (ref 5–15)
BUN: 8 mg/dL (ref 6–20)
CO2: 20 mmol/L — ABNORMAL LOW (ref 22–32)
Calcium: 9.3 mg/dL (ref 8.9–10.3)
Chloride: 107 mmol/L (ref 98–111)
Creatinine, Ser: 0.6 mg/dL (ref 0.44–1.00)
GFR calc Af Amer: >60 mL/min (ref >60)
GFR calc non Af Amer: >60 mL/min (ref >60)
Glucose, Bld: 71 mg/dL (ref 70–99)
Potassium: 4.4 mmol/L (ref 3.5–5.1)
Sodium: 137 mmol/L (ref 135–145)
Total Bilirubin: 0.2 mg/dL — ABNORMAL LOW (ref 0.3–1.2)
Total Protein: 6 g/dL — ABNORMAL LOW (ref 6.5–8.1)

## 2018-12-14 LAB — TYPE AND SCREEN
ABO/RH(D): O POS
Antibody Screen: NEGATIVE

## 2018-12-14 LAB — PROTEIN / CREATININE RATIO, URINE
Creatinine, Urine: 151.76 mg/dL
Protein Creatinine Ratio: 0.45 mg/mg{Cre} — ABNORMAL HIGH (ref 0.00–0.15)
Total Protein, Urine: 68 mg/dL

## 2018-12-14 LAB — SARS CORONAVIRUS 2 BY RT PCR (HOSPITAL ORDER, PERFORMED IN ~~LOC~~ HOSPITAL LAB): SARS Coronavirus 2: NEGATIVE

## 2018-12-14 MED ORDER — TERBUTALINE SULFATE 1 MG/ML IJ SOLN: 0.2500 mg | Freq: Once | INTRAMUSCULAR | Status: DC | PRN

## 2018-12-14 MED ORDER — OXYCODONE-ACETAMINOPHEN 5-325 MG PO TABS: 2.0000 | ORAL_TABLET | ORAL | Status: DC | PRN

## 2018-12-14 MED ORDER — SODIUM CHLORIDE 0.9 % IV SOLN
5.0000 10*6.[IU] | Freq: Once | INTRAVENOUS | Status: AC
  Administered 2018-12-14: 5 10*6.[IU] via INTRAVENOUS
  Filled 2018-12-14: qty 5

## 2018-12-14 MED ORDER — LACTATED RINGERS IV SOLN: 500.0000 mL | INTRAVENOUS | Status: DC | PRN

## 2018-12-14 MED ORDER — LACTATED RINGERS IV SOLN
500.0000 mL | Freq: Once | INTRAVENOUS | Status: AC
  Administered 2018-12-14: 500 mL via INTRAVENOUS

## 2018-12-14 MED ORDER — EPHEDRINE 5 MG/ML INJ: 10.0000 mg | INTRAVENOUS | Status: DC | PRN

## 2018-12-14 MED ORDER — OXYTOCIN BOLUS FROM INFUSION
500.0000 mL | Freq: Once | INTRAVENOUS | Status: AC
  Administered 2018-12-15: 500 mL via INTRAVENOUS

## 2018-12-14 MED ORDER — OXYTOCIN 40 UNITS IN NORMAL SALINE INFUSION - SIMPLE MED
1.0000 m[IU]/min | INTRAVENOUS | Status: DC
  Administered 2018-12-14: 2 m[IU]/min via INTRAVENOUS

## 2018-12-14 MED ORDER — SODIUM CHLORIDE (PF) 0.9 % IJ SOLN
INTRAMUSCULAR | Status: DC | PRN
  Administered 2018-12-14: 12 mL/h via EPIDURAL

## 2018-12-14 MED ORDER — LACTATED RINGERS IV SOLN
INTRAVENOUS | Status: DC
  Administered 2018-12-14 (×2): via INTRAVENOUS

## 2018-12-14 MED ORDER — ACETAMINOPHEN 325 MG PO TABS: 650.0000 mg | ORAL_TABLET | ORAL | Status: DC | PRN

## 2018-12-14 MED ORDER — LIDOCAINE HCL (PF) 1 % IJ SOLN
INTRAMUSCULAR | Status: DC | PRN
  Administered 2018-12-14 (×2): 5 mL via EPIDURAL

## 2018-12-14 MED ORDER — FENTANYL-BUPIVACAINE-NACL 0.5-0.125-0.9 MG/250ML-% EP SOLN
12.0000 mL/h | EPIDURAL | Status: DC | PRN
  Filled 2018-12-14: qty 250

## 2018-12-14 MED ORDER — PHENYLEPHRINE 40 MCG/ML (10ML) SYRINGE FOR IV PUSH (FOR BLOOD PRESSURE SUPPORT): 80.0000 ug | PREFILLED_SYRINGE | INTRAVENOUS | Status: DC | PRN

## 2018-12-14 MED ORDER — PENICILLIN G 3 MILLION UNITS IVPB - SIMPLE MED
3.0000 10*6.[IU] | INTRAVENOUS | Status: DC
  Administered 2018-12-14 (×2): 3 10*6.[IU] via INTRAVENOUS
  Filled 2018-12-14 (×3): qty 100

## 2018-12-14 MED ORDER — OXYCODONE-ACETAMINOPHEN 5-325 MG PO TABS: 1.0000 | ORAL_TABLET | ORAL | Status: DC | PRN

## 2018-12-14 MED ORDER — DIPHENHYDRAMINE HCL 50 MG/ML IJ SOLN: 12.5000 mg | INTRAMUSCULAR | Status: DC | PRN

## 2018-12-14 MED ORDER — LACTATED RINGERS IV SOLN: 500.0000 mL | Freq: Once | INTRAVENOUS | Status: DC

## 2018-12-14 MED ORDER — SOD CITRATE-CITRIC ACID 500-334 MG/5ML PO SOLN
30.0000 mL | ORAL | Status: DC | PRN
  Administered 2018-12-14: 30 mL via ORAL
  Filled 2018-12-14: qty 30

## 2018-12-14 MED ORDER — ONDANSETRON HCL 4 MG/2ML IJ SOLN
4.0000 mg | Freq: Four times a day (QID) | INTRAMUSCULAR | Status: DC | PRN
  Administered 2018-12-14: 4 mg via INTRAVENOUS
  Filled 2018-12-14: qty 2

## 2018-12-14 MED ORDER — LIDOCAINE HCL (PF) 1 % IJ SOLN
30.0000 mL | INTRAMUSCULAR | Status: AC | PRN
  Administered 2018-12-14: 30 mL via SUBCUTANEOUS
  Filled 2018-12-14: qty 30

## 2018-12-14 MED ORDER — OXYTOCIN 40 UNITS IN NORMAL SALINE INFUSION - SIMPLE MED
2.5000 [IU]/h | INTRAVENOUS | Status: DC
  Filled 2018-12-14: qty 1000

## 2018-12-14 MED ORDER — FLEET ENEMA 7-19 GM/118ML RE ENEM: 1.0000 | ENEMA | RECTAL | Status: DC | PRN

## 2018-12-14 NOTE — MAU Note (Signed)
.   Sheila Sexton is a 23 y.o. at [redacted]w[redacted]d here in MAU reporting: increase in blood pressure and swelling in her hands, feet, and face.. Was told to come in and be evaluated  :  Pain score: 0 Vitals:   12/14/18 1137 12/14/18 1138  BP:  (!) 157/85  Pulse: 82 98  Resp: 16   Temp: 98.2 F (36.8 C)   SpO2: 100%      FHT:160 Lab orders placed from triage: UA

## 2018-12-14 NOTE — MAU Provider Note (Signed)
Chief Complaint  Patient presents with  . Hypertension     First Provider Initiated Contact with Patient 12/14/18 1204      S: Sheila Sexton  is a 23 y.o. y.o. year old G29P0 female at [redacted]w[redacted]d weeks gestation who presents to MAU with elevated blood pressures. Denies Hx of hypertension. States her BP has been elevated at home since yesterday, 140s/80s. Had an elevated BP once in MAU last month as well that prompted a PEC evaluation. Was told in the office that she has had increase in urinary protein recently.   Associated symptoms: denies Headache, denies vision changes, denies epigastric pain Contractions: denies Vaginal bleeding: denies Fetal movement: good  O:  Patient Vitals for the past 24 hrs:  BP Temp Pulse Resp SpO2  12/14/18 1300 139/80 - 85 - 100 %  12/14/18 1255 - - - - 100 %  12/14/18 1250 - - - - 99 %  12/14/18 1245 139/71 - 80 - 99 %  12/14/18 1240 - - - - 99 %  12/14/18 1235 - - - - 99 %  12/14/18 1230 131/81 - 87 - 100 %  12/14/18 1225 - - - - 100 %  12/14/18 1220 - - - - 100 %  12/14/18 1215 (!) 150/82 - 88 - 99 %  12/14/18 1210 - - - - 99 %  12/14/18 1205 - - - - 99 %  12/14/18 1200 (!) 149/85 - 87 - 99 %  12/14/18 1155 - - - - 99 %  12/14/18 1150 - - - - 99 %  12/14/18 1149 (!) 158/88 - 92 - -  12/14/18 1138 (!) 157/85 - 98 - -  12/14/18 1137 - 98.2 F (36.8 C) 82 16 100 %   General: NAD Heart: Regular rate Lungs: Normal rate and effort Abd: Soft, NT, Gravid, S=D Extremities: 2+ pittingPedal edema Neuro: 3+ deep tendon reflexes, 1 beat clonus  Dilation: 3 Effacement (%): 60 Cervical Position: Anterior Station: -2 Presentation: Vertex Exam by:: Frances Maywood RN   NST:  Baseline: 135 bpm, Variability: Good {> 6 bpm), Accelerations: Reactive and Decelerations: Absent  Results for orders placed or performed during the hospital encounter of 12/14/18 (from the past 24 hour(s))  Protein / creatinine ratio, urine     Status: Abnormal   Collection Time: 12/14/18  11:47 AM  Result Value Ref Range   Creatinine, Urine 151.76 mg/dL   Total Protein, Urine 68 mg/dL   Protein Creatinine Ratio 0.45 (H) 0.00 - 0.15 mg/mg[Cre]  CBC     Status: Abnormal   Collection Time: 12/14/18 12:03 PM  Result Value Ref Range   WBC 9.6 4.0 - 10.5 K/uL   RBC 3.80 (L) 3.87 - 5.11 MIL/uL   Hemoglobin 10.9 (L) 12.0 - 15.0 g/dL   HCT 33.7 (L) 36.0 - 46.0 %   MCV 88.7 80.0 - 100.0 fL   MCH 28.7 26.0 - 34.0 pg   MCHC 32.3 30.0 - 36.0 g/dL   RDW 13.8 11.5 - 15.5 %   Platelets 163 150 - 400 K/uL   nRBC 0.0 0.0 - 0.2 %  Comprehensive metabolic panel     Status: Abnormal   Collection Time: 12/14/18 12:03 PM  Result Value Ref Range   Sodium 137 135 - 145 mmol/L   Potassium 4.4 3.5 - 5.1 mmol/L   Chloride 107 98 - 111 mmol/L   CO2 20 (L) 22 - 32 mmol/L   Glucose, Bld 71 70 - 99 mg/dL   BUN  8 6 - 20 mg/dL   Creatinine, Ser 1.610.60 0.44 - 1.00 mg/dL   Calcium 9.3 8.9 - 09.610.3 mg/dL   Total Protein 6.0 (L) 6.5 - 8.1 g/dL   Albumin 2.9 (L) 3.5 - 5.0 g/dL   AST 23 15 - 41 U/L   ALT 13 0 - 44 U/L   Alkaline Phosphatase 193 (H) 38 - 126 U/L   Total Bilirubin 0.2 (L) 0.3 - 1.2 mg/dL   GFR calc non Af Amer >60 >60 mL/min   GFR calc Af Amer >60 >60 mL/min   Anion gap 10 5 - 15    A:  1. Pre-eclampsia in third trimester   2. [redacted] weeks gestation of pregnancy   3. Positive GBS test       P:  Admit to birthing suites per consult with Philip Aspenallahan, [place], DO. Admission orders placed  Sheila Sexton, Sheila Baney, NP 12/14/2018 1:24 PM

## 2018-12-14 NOTE — Anesthesia Procedure Notes (Signed)
Epidural Patient location during procedure: OB Start time: 12/14/2018 5:36 PM End time: 12/14/2018 5:45 PM  Staffing Anesthesiologist: Albertha Ghee, MD Performed: anesthesiologist   Preanesthetic Checklist Completed: patient identified, site marked, pre-op evaluation, timeout performed, IV checked, risks and benefits discussed and monitors and equipment checked  Epidural Patient position: sitting Prep: DuraPrep Patient monitoring: heart rate, cardiac monitor, continuous pulse ox and blood pressure Approach: midline Location: L2-L3 Injection technique: LOR saline  Needle:  Needle type: Tuohy  Needle gauge: 17 G Needle length: 9 cm Needle insertion depth: 5 cm Catheter type: closed end flexible Catheter size: 19 Gauge Catheter at skin depth: 11 cm Test dose: negative and Other  Assessment Events: blood not aspirated, injection not painful, no injection resistance and negative IV test  Additional Notes Informed consent obtained prior to proceeding including risk of failure, 1% risk of PDPH, risk of minor discomfort and bruising.  Discussed rare but serious complications including epidural abscess, permanent nerve injury, epidural hematoma.  Discussed alternatives to epidural analgesia and patient desires to proceed.  Timeout performed pre-procedure verifying patient name, procedure, and platelet count.  Patient tolerated procedure well. Reason for block:procedure for pain

## 2018-12-14 NOTE — H&P (Signed)
23 y.o. 2863w3d  G1P0 comes in c/o elevated blood pressures at home, history of elevated BP in office.  Had h.o elevated BP at 34 weeks when seen in MAU, have been normal since.  Otherwise has good fetal movement and no bleeding.  Past Medical History:  Diagnosis Date  . Ovarian cyst   . Preeclampsia, third trimester 12/14/2018    Past Surgical History:  Procedure Laterality Date  . CYSTOSCOPY    . LAPAROSCOPY    . WISDOM TOOTH EXTRACTION      OB History  Gravida Para Term Preterm AB Living  1            SAB TAB Ectopic Multiple Live Births               # Outcome Date GA Lbr Len/2nd Weight Sex Delivery Anes PTL Lv  1 Current             Social History   Socioeconomic History  . Marital status: Married    Spouse name: Mayo AoMichael Callow  . Number of children: Not on file  . Years of education: Not on file  . Highest education level: Not on file  Occupational History  . Occupation: Psychologist, sport and exercisenurse tech  Social Needs  . Financial resource strain: Not hard at all  . Food insecurity    Worry: Never true    Inability: Never true  . Transportation needs    Medical: No    Non-medical: No  Tobacco Use  . Smoking status: Never Smoker  . Smokeless tobacco: Never Used  Substance and Sexual Activity  . Alcohol use: No  . Drug use: No  . Sexual activity: Yes  Lifestyle  . Physical activity    Days per week: 0 days    Minutes per session: 0 min  . Stress: Not at all  Relationships  . Social connections    Talks on phone: More than three times a week    Gets together: Twice a week    Attends religious service: 1 to 4 times per year    Active member of club or organization: No    Attends meetings of clubs or organizations: Never    Relationship status: Married  . Intimate partner violence    Fear of current or ex partner: Not on file    Emotionally abused: Not on file    Physically abused: Not on file    Forced sexual activity: Not on file  Other Topics Concern  . 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    Prenatal Transfer Tool  Maternal Diabetes: No Genetic Screening: Declined Maternal Ultrasounds/Referrals: Normal Fetal Ultrasounds or other Referrals:  None Maternal Substance Abuse:  No Significant Maternal Medications:  None Significant Maternal Lab Results: Group B Strep positive  Other PNC: uncomplicated.    Vitals:   12/14/18 1404 12/14/18 1444 12/14/18 1502 12/14/18 1532  BP: (!) 147/83 (!) 144/78 (!) 143/82 (!) 124/56  Pulse: 77 79 80 76  Resp: 17   16  Temp: 98.2 F (36.8 C)     TempSrc: Oral     SpO2:      Weight: 75.9 kg     Height: 5\' 5"  (1.651 m)       Lungs/Cor:  NAD Abdomen:  soft, gravid Ex:  no cords,  erythema SVE:  3/60/-2 FHTs:  135, good STV, NST R; Cat 1 tracing. Toco:  q 2-7   A/P   Admitted for IOL for GHTN, possible PEC with elevated urine protein  GBS Pos- PCN  Pitocin 2x2  Epidural when desired  Consider AROM once PCN in for >/=4hrs  Allyn Kenner

## 2018-12-14 NOTE — Anesthesia Preprocedure Evaluation (Signed)
Anesthesia Evaluation  Patient identified by MRN, date of birth, ID band Patient awake    Reviewed: Allergy & Precautions, H&P , NPO status , Patient's Chart, lab work & pertinent test results  Airway Mallampati: II   Neck ROM: full    Dental   Pulmonary neg pulmonary ROS,    breath sounds clear to auscultation       Cardiovascular hypertension,  Rhythm:regular Rate:Normal     Neuro/Psych    GI/Hepatic   Endo/Other    Renal/GU      Musculoskeletal   Abdominal   Peds  Hematology   Anesthesia Other Findings   Reproductive/Obstetrics (+) Pregnancy                             Anesthesia Physical Anesthesia Plan  ASA: II  Anesthesia Plan: Epidural   Post-op Pain Management:    Induction: Intravenous  PONV Risk Score and Plan: 2 and Treatment may vary due to age or medical condition  Airway Management Planned: Natural Airway  Additional Equipment:   Intra-op Plan:   Post-operative Plan:   Informed Consent: I have reviewed the patients History and Physical, chart, labs and discussed the procedure including the risks, benefits and alternatives for the proposed anesthesia with the patient or authorized representative who has indicated his/her understanding and acceptance.     Plan Discussed with: Anesthesiologist  Anesthesia Plan Comments:         Anesthesia Quick Evaluation  

## 2018-12-15 LAB — CBC
HCT: 29.9 % — ABNORMAL LOW (ref 36.0–46.0)
Hemoglobin: 9.6 g/dL — ABNORMAL LOW (ref 12.0–15.0)
MCH: 29.1 pg (ref 26.0–34.0)
MCHC: 32.1 g/dL (ref 30.0–36.0)
MCV: 90.6 fL (ref 80.0–100.0)
Platelets: 145 10*3/uL — ABNORMAL LOW (ref 150–400)
RBC: 3.3 MIL/uL — ABNORMAL LOW (ref 3.87–5.11)
RDW: 14 % (ref 11.5–15.5)
WBC: 12.5 10*3/uL — ABNORMAL HIGH (ref 4.0–10.5)
nRBC: 0 % (ref 0.0–0.2)

## 2018-12-15 LAB — ABO/RH: ABO/RH(D): O POS

## 2018-12-15 LAB — BIRTH TISSUE RECOVERY COLLECTION (PLACENTA DONATION)

## 2018-12-15 LAB — RPR: RPR Ser Ql: NONREACTIVE

## 2018-12-15 MED ORDER — TETANUS-DIPHTH-ACELL PERTUSSIS 5-2.5-18.5 LF-MCG/0.5 IM SUSP: 0.5000 mL | Freq: Once | INTRAMUSCULAR | Status: DC

## 2018-12-15 MED ORDER — DIPHENHYDRAMINE HCL 25 MG PO CAPS: 25.0000 mg | ORAL_CAPSULE | Freq: Four times a day (QID) | ORAL | Status: DC | PRN

## 2018-12-15 MED ORDER — COCONUT OIL OIL: 1.0000 "application " | TOPICAL_OIL | Status: DC | PRN

## 2018-12-15 MED ORDER — IBUPROFEN 600 MG PO TABS
600.0000 mg | ORAL_TABLET | Freq: Four times a day (QID) | ORAL | Status: DC
  Administered 2018-12-15 – 2018-12-16 (×7): 600 mg via ORAL
  Filled 2018-12-15 (×7): qty 1

## 2018-12-15 MED ORDER — ONDANSETRON HCL 4 MG PO TABS: 4.0000 mg | ORAL_TABLET | ORAL | Status: DC | PRN

## 2018-12-15 MED ORDER — SIMETHICONE 80 MG PO CHEW: 80.0000 mg | CHEWABLE_TABLET | ORAL | Status: DC | PRN

## 2018-12-15 MED ORDER — ACETAMINOPHEN 325 MG PO TABS: 650.0000 mg | ORAL_TABLET | ORAL | Status: DC | PRN

## 2018-12-15 MED ORDER — WITCH HAZEL-GLYCERIN EX PADS: 1.0000 "application " | MEDICATED_PAD | CUTANEOUS | Status: DC | PRN

## 2018-12-15 MED ORDER — OXYCODONE-ACETAMINOPHEN 5-325 MG PO TABS: 1.0000 | ORAL_TABLET | ORAL | Status: DC | PRN

## 2018-12-15 MED ORDER — DIBUCAINE (PERIANAL) 1 % EX OINT: 1.0000 "application " | TOPICAL_OINTMENT | CUTANEOUS | Status: DC | PRN

## 2018-12-15 MED ORDER — ZOLPIDEM TARTRATE 5 MG PO TABS: 5.0000 mg | ORAL_TABLET | Freq: Every evening | ORAL | Status: DC | PRN

## 2018-12-15 MED ORDER — OXYCODONE-ACETAMINOPHEN 5-325 MG PO TABS: 2.0000 | ORAL_TABLET | ORAL | Status: DC | PRN

## 2018-12-15 MED ORDER — ONDANSETRON HCL 4 MG/2ML IJ SOLN: 4.0000 mg | INTRAMUSCULAR | Status: DC | PRN

## 2018-12-15 MED ORDER — PRENATAL MULTIVITAMIN CH
1.0000 | ORAL_TABLET | Freq: Every day | ORAL | Status: DC
  Administered 2018-12-15 – 2018-12-16 (×2): 1 via ORAL
  Filled 2018-12-15 (×2): qty 1

## 2018-12-15 MED ORDER — BENZOCAINE-MENTHOL 20-0.5 % EX AERO
1.0000 "application " | INHALATION_SPRAY | CUTANEOUS | Status: DC | PRN
  Filled 2018-12-15: qty 56

## 2018-12-15 MED ORDER — SENNOSIDES-DOCUSATE SODIUM 8.6-50 MG PO TABS
2.0000 | ORAL_TABLET | ORAL | Status: DC
  Administered 2018-12-15: 2 via ORAL
  Filled 2018-12-15: qty 2

## 2018-12-15 MED ORDER — ESCITALOPRAM OXALATE 20 MG PO TABS
20.0000 mg | ORAL_TABLET | Freq: Every day | ORAL | Status: DC
  Administered 2018-12-15 – 2018-12-16 (×2): 20 mg via ORAL
  Filled 2018-12-15 (×2): qty 1

## 2018-12-15 NOTE — Progress Notes (Signed)
Patient is eating, ambulating, voiding.  Pain control is good.  Appropriate lochia, no complaints.  Vitals:   12/15/18 0146 12/15/18 0220 12/15/18 0331 12/15/18 0730  BP: 139/76 (!) 142/78 (!) 116/54 (!) 141/86  Pulse: 98 99 81 83  Resp:  16 16 16   Temp:  97.7 F (36.5 C) 98.8 F (37.1 C) 98.7 F (37.1 C)  TempSrc:  Oral Oral Oral  SpO2:  100%  100%  Weight:      Height:        Fundus firm Abd: soft, nontender Ext: no calf tendernss  Lab Results  Component Value Date   WBC 12.5 (H) 12/15/2018   HGB 9.6 (L) 12/15/2018   HCT 29.9 (L) 12/15/2018   MCV 90.6 12/15/2018   PLT 145 (L) 12/15/2018    --/--/O POS, O POS Performed at [facility] [address], [place], [place] [address]  [phone])  A/P Post partum day 1(short day 0). Mild anemia- PNV containing Fe Desires circ: risks/benefits/complications reviewed with pt and consent signed. Pt would like her usual Lexapro 20mg  ordered.  Routine care.  Expect d/c 7/19.    Allyn Kenner

## 2018-12-15 NOTE — Progress Notes (Signed)
MOB was referred for history of anxiety.  * Referral screened out by Clinical Social Worker because none of the following criteria appear to apply:  ~ History of anxiety/depression during this pregnancy, or of post-partum depression following prior delivery. ~ Diagnosis of anxiety and/or depression within last 3 years OR * MOB's symptoms currently being treated with medication and/or therapy. MOB has active prescription for Lexapro.  Please contact the Clinical Social Worker if needs arise, by [facility] request, or if MOB scores greater than 9/yes to question 10 on Edinburgh Postpartum Depression Screen.  Sheila Sexton, [place]  [facility] [employer] [phone]

## 2018-12-15 NOTE — Anesthesia Postprocedure Evaluation (Signed)
Anesthesia Post Note  Patient: Sheila Sexton  Procedure(s) Performed: AN AD HOC LABOR EPIDURAL     Patient location during evaluation: Mother Baby Anesthesia Type: Epidural Level of consciousness: awake Pain management: satisfactory to patient Vital Signs Assessment: post-procedure vital signs reviewed and stable Respiratory status: spontaneous breathing Cardiovascular status: stable Anesthetic complications: no    Last Vitals:  Vitals:   12/15/18 0331 12/15/18 0730  BP: (!) 116/54 (!) 141/86  Pulse: 81 83  Resp: 16 16  Temp: 37.1 C 37.1 C  SpO2:  100%    Last Pain:  Vitals:   12/15/18 0730  TempSrc: Oral  PainSc: 2    Pain Goal:                   [employer]

## 2018-12-16 ENCOUNTER — Encounter (HOSPITAL_COMMUNITY): Payer: Self-pay | Admitting: *Deleted

## 2018-12-16 NOTE — Lactation Note (Addendum)
This note was copied from a baby's chart. Lactation Consultation Note  Patient Name: Boy Jeryn Bertoni [id number] Date: 12/16/2018 Reason for consult: Initial assessment\  Baby 5 hours old.  [employer] Employee.  Request from RN to assist w/ breastfeeding due to BS 42 and Ped MD request. Upon entering baby had recently bf for 15 min on R breast and RN was giving baby glucose gel. Assisted mother w/ latching in football hold on L breast with intermittent sucks and swallows encouraged by breast compression. Noted abrasions on tip of L nipple but mother states it does not hurt to latch and she feels it was early trauma to nipple. LC will check back later today after results from [facility] test.  Suggest hand expressing and giving additional breastmilk to baby if baby will not latch and STS.   Returned to room to assist w/ latching. Helped mother latch baby in side lying position having FOB keep a watchful eye. Baby latches easily with intermittent swallows.   RN brought in more formula.  Suggest to parents if they decide they can supplement with breastmilk via spoon if they choose and a DEBP can be setup . Mother states her mother is bringing her own DEBP. Mom made aware of O/P services, breastfeeding support groups, community resources, and our phone # for post-discharge questions.  Feed on demand approximately 8-12 times per day.      Maternal Data Has patient been taught Hand Expression?: Yes  Feeding Feeding Type: Breast Fed  LATCH Score Latch: Grasps breast easily, tongue down, lips flanged, rhythmical sucking.  Audible Swallowing: A few with stimulation  Type of Nipple: Everted at rest and after stimulation  Comfort (Breast/Nipple): Soft / non-tender  Hold (Positioning): Assistance needed to correctly position infant at breast and maintain latch.  LATCH Score: 8  Interventions Interventions: Breast feeding basics reviewed;Assisted with latch;Skin to skin  Lactation Tools  Discussed/Used     Consult Status Consult Status: Follow-up Date: 12/17/18 Follow-up type: In-patient    Vivianne Master [facility] 12/16/2018, 8:57 AM

## 2018-12-16 NOTE — Progress Notes (Signed)
Patient is eating, ambulating, voiding.  Pain control is good.  Appropriate lochia, no complaints.  Baby with low blood pressures, peds has asked to delay circumcision.  Vitals:   12/15/18 1132 12/15/18 1520 12/15/18 2129 12/16/18 0604  BP: (!) 141/74 (!) 141/80 (!) 147/91 129/71  Pulse: 80  83 85  Resp: 16 16 18 18   Temp: 98.3 F (36.8 C)  97.8 F (36.6 C) 97.7 F (36.5 C)  TempSrc: Oral  Oral Oral  SpO2:  100% 100% 100%  Weight:      Height:        Fundus firm Abd: nontender Ext: no calf tenderness  Lab Results  Component Value Date   WBC 12.5 (H) 12/15/2018   HGB 9.6 (L) 12/15/2018   HCT 29.9 (L) 12/15/2018   MCV 90.6 12/15/2018   PLT 145 (L) 12/15/2018    --/--/O POS, O POS (07/17 1203)  A/P Post partum day 2. Pt doing well, no complaints. Elevated blood pressure:  Pt asymptomatic and latest BP normal, overnight persistent mild range, will continue to monitor and consider late discharge if remain normal and mild into afternoon. Will checkin with nursery this afternoon to see if baby is cleared for circ, if not, may have to perform tomorrow.  Routine care.    Allyn Kenner

## 2018-12-16 NOTE — Discharge Summary (Signed)
Obstetric Discharge Summary Reason for Admission: GHTN Prenatal Procedures: Preeclampsia and ultrasound Intrapartum Procedures: spontaneous vaginal delivery Postpartum Procedures: none Complications-Operative and Postpartum: 2nd degree perineal laceration and bilateral labial as well as left sulcal tears repaired Hemoglobin  Date Value Ref Range Status  12/15/2018 9.6 (L) 12.0 - 15.0 g/dL Final   HCT  Date Value Ref Range Status  12/15/2018 29.9 (L) 36.0 - 46.0 % Final    Physical Exam:  General: alert and cooperative Lochia: appropriate Uterine Fundus: firm DVT Evaluation: No evidence of DVT seen on physical exam.  Discharge Diagnoses: Term Pregnancy-delivered  Discharge Information: Date: 12/16/2018 Activity: pelvic rest Diet: routine Medications: PNV and Ibuprofen Condition: stable Instructions: refer to practice specific booklet Discharge to: home Follow-up Information    Sheila Kenner, DO Follow up in 4 week(s).   Specialty: Obstetrics and Gynecology Contact information: [address] [place] [address] [phone]           Newborn Data: Live born female  Birth Weight: 8 lb 8 oz (3856 g) APGAR: 36, 9  Newborn Delivery   Birth date/time: 12/14/2018 23:59:00 Delivery type: Vaginal, Spontaneous      Baby to remain and mother will be discharged but stay with baby.  Sheila Sexton 12/16/2018, 5:07 PM

## 2018-12-17 ENCOUNTER — Ambulatory Visit: Payer: Self-pay

## 2018-12-17 NOTE — Lactation Note (Signed)
This note was copied from a baby's chart. Lactation Consultation Note  Patient Name: Sheila Sexton [id number] Date: 12/17/2018 Reason for consult: Follow-up assessment Baby is 57 hours old/5% weight loss.  Mom states that baby latches for 15 minutes on both breasts and then she supplements with formula.  Discussed milk coming to volume and the prevention and treatment of engorgement.  Mom has a DEBP for home use.  Mom states breasts are tender and requesting comfort gels.  Comfort gels given with instructions.  Recommended mom discontinue supplementation when milk comes to volume.  Reviewed lactation outpatient services and encouraged to call prn.  Maternal Data    Feeding Feeding Type: Bottle Fed - Formula Nipple Type: Slow - flow  LATCH Score                   Interventions    Lactation Tools Discussed/Used     Consult Status Consult Status: Complete Follow-up type: Call as needed    Ave Filter 12/17/2018, 9:46 AM

## 2019-01-04 DIAGNOSIS — Z8759 Personal history of other complications of pregnancy, childbirth and the puerperium: Secondary | ICD-10-CM | POA: Insufficient documentation

## 2019-01-22 ENCOUNTER — Other Ambulatory Visit: Payer: Self-pay

## 2019-01-22 DIAGNOSIS — Z20822 Contact with and (suspected) exposure to covid-19: Secondary | ICD-10-CM

## 2019-01-24 LAB — NOVEL CORONAVIRUS, NAA: SARS-CoV-2, NAA: NOT DETECTED

## 2019-01-25 ENCOUNTER — Encounter (HOSPITAL_COMMUNITY): Payer: Self-pay

## 2019-01-25 ENCOUNTER — Ambulatory Visit (HOSPITAL_COMMUNITY)
Admission: EM | Admit: 2019-01-25 | Discharge: 2019-01-25 | Disposition: A | Discharge status: Home / Self Care | Payer: 59 | Attending: Emergency Medicine | Admitting: Emergency Medicine

## 2019-01-25 ENCOUNTER — Other Ambulatory Visit: Payer: Self-pay

## 2019-01-25 DIAGNOSIS — N61 Mastitis without abscess: Secondary | ICD-10-CM

## 2019-01-25 MED ORDER — CEPHALEXIN 500 MG PO CAPS: 500.0000 mg | ORAL_CAPSULE | Freq: Four times a day (QID) | ORAL | 0 refills | Status: AC

## 2019-01-25 NOTE — ED Provider Notes (Signed)
[facility]    CSN: [id number] Arrival date & time: 01/25/19  1741      History   Chief Complaint Chief Complaint  Patient presents with  . Fever    HPI Sheila Sexton is a 23 y.o. female.   Patient presents with right breast redness and pain when breast-feeding her infant today.  She also reports fever and chills; T-max 102.8 today.  She was seen by her OB/GYN today and had an IUD placed but was not treated for mastitis as her breast was not red at that time.  She had a negative COVID test on 01/22/2019.     The history is provided by the patient.    Past Medical History:  Diagnosis Date  . Ovarian cyst   . Preeclampsia, third trimester 12/14/2018    Patient Active Problem List   Diagnosis Date Noted  . Preeclampsia, third trimester 12/14/2018  . Pregnancy 03/20/2018  . Generalized anxiety disorder with panic attacks 09/28/2017  . Sleep disturbance 09/28/2017    Past Surgical History:  Procedure Laterality Date  . CYSTOSCOPY    . LAPAROSCOPY    . WISDOM TOOTH EXTRACTION      OB History    Gravida  1   Para      Term      Preterm      AB      Living        SAB      TAB      Ectopic      Multiple      Live Births               Home Medications    Prior to Admission medications   Medication Sig Start Date End Date Taking? Authorizing Provider  acetaminophen (TYLENOL) 325 MG tablet Take 650 mg by mouth every 6 (six) hours as needed.    [provider]  Acidophilus Lactobacillus CAPS Take 1 tablet by mouth daily as needed. 08/23/18   Calvert CantorWeinhold, Samantha C, CNM  cephALEXin (KEFLEX) 500 MG capsule Take 1 capsule (500 mg total) by mouth 4 (four) times daily for 10 days. 01/25/19 02/04/19  Mickie Bailate, Britteney Ayotte H, NP  escitalopram (LEXAPRO) 20 MG tablet Take 1 tablet (20 mg total) by mouth daily. 12/22/17   Valarie ConesWeber, Dema SeverinSarah L, PA-C  Prenatal Vit-Fe Fumarate-FA (PRENATAL MULTIVITAMIN) TABS tablet Take 1 tablet by mouth daily at 12 noon.    [provider]    Family History Family History  Problem Relation Age of Onset  . Depression Mother   . Anxiety disorder Mother   . Healthy Sister   . Cancer Maternal Grandmother   . Healthy Sister     Social History Social History   Tobacco Use  . Smoking status: Never Smoker  . Smokeless tobacco: Never Used  Substance Use Topics  . Alcohol use: No  . Drug use: No     Allergies   Patient has no known allergies.   Review of Systems Review of Systems  Constitutional: Positive for chills and fever.  HENT: Negative for congestion, ear pain, rhinorrhea and sore throat.   Eyes: Negative for pain and visual disturbance.  Respiratory: Negative for cough and shortness of breath.   Cardiovascular: Negative for chest pain and palpitations.  Gastrointestinal: Negative for abdominal pain, constipation, diarrhea, nausea and vomiting.  Genitourinary: Negative for dysuria and hematuria.  Musculoskeletal: Negative for arthralgias and back pain.  Skin: Negative for color change and rash.  Neurological:  Negative for seizures and syncope.  All other systems reviewed and are negative.    Physical Exam Triage Vital Signs ED Triage Vitals  Enc Vitals Group     BP 01/25/19 1804 133/76     Pulse Rate 01/25/19 1804 (!) 103     Resp 01/25/19 1804 18     Temp 01/25/19 1804 100.3 F (37.9 C)     Temp Source 01/25/19 1804 Oral     SpO2 01/25/19 1804 97 %     Weight --      Height --      Head Circumference --      Peak Flow --      Pain Score 01/25/19 1805 5     Pain Loc --      Pain Edu? --      Excl. in GC? --    No data found.  Updated Vital Signs BP 133/76 (BP Location: Right Arm)   Pulse (!) 103   Temp 100.3 F (37.9 C) (Oral)   Resp 18   SpO2 97%   Visual Acuity Right Eye Distance:   Left Eye Distance:   Bilateral Distance:    Right Eye Near:   Left Eye Near:    Bilateral Near:     Physical Exam Vitals signs and nursing note reviewed.  Constitutional:       General: She is not in acute distress.    Appearance: She is well-developed.  HENT:     Head: Normocephalic and atraumatic.  Eyes:     Conjunctiva/sclera: Conjunctivae normal.  Neck:     Musculoskeletal: Neck supple.  Cardiovascular:     Rate and Rhythm: Normal rate and regular rhythm.     Heart sounds: No murmur.  Pulmonary:     Effort: Pulmonary effort is normal. No respiratory distress.     Breath sounds: Normal breath sounds.  Abdominal:     General: Bowel sounds are normal.     Palpations: Abdomen is soft.     Tenderness: There is no abdominal tenderness. There is no right CVA tenderness, left CVA tenderness, guarding or rebound.  Skin:    General: Skin is warm and dry.     Findings: Erythema present.     Comments: Right breast: mild erythema.   Neurological:     Mental Status: She is alert.      UC Treatments / Results  Labs (all labs ordered are listed, but only abnormal results are displayed) Labs Reviewed - No data to display  EKG   Radiology No results found.  Procedures Procedures (including critical care time)  Medications Ordered in UC Medications - No data to display  Initial Impression / Assessment and Plan / UC Course  I have reviewed the triage vital signs and the nursing notes.  Pertinent labs & imaging results that were available during my care of the patient were reviewed by me and considered in my medical decision making (see chart for details).    Right mastitis, acute.  Treating with Keflex x10 days.  Instructed patient to follow-up with her OB/GYN or PCP on Monday for recheck.  Discussed with patient that she should return here or go to the ED if she develops high fever, increased redness, increased pain, or other concerning symptoms.  Patient verbalizes understanding and agrees to treatment plan.     Final Clinical Impressions(s) / UC Diagnoses   Final diagnoses:  Mastitis, right, acute     Discharge Instructions     Take  the  antibiotic as prescribed 4 times a day for 10 days.    Follow-up with your OB/GYN or your primary care provider on Monday for a recheck.    Return here or go to the emergency department if you develop high fever, increased redness or pain in your breast, or other concerning symptoms.        ED Prescriptions    Medication Sig Dispense Auth. Provider   cephALEXin (KEFLEX) 500 MG capsule Take 1 capsule (500 mg total) by mouth 4 (four) times daily for 10 days. 40 capsule Sharion Balloon, NP     Controlled Substance Prescriptions [place] Controlled Substance Registry consulted? Not Applicable   Sharion Balloon, NP 01/25/19 [phone]

## 2019-01-25 NOTE — ED Triage Notes (Signed)
Pt presents to UC w/ c/o fever, chills, and pain in breast since yesterday. Pt reports seeing OB doctor who states it is not mastitis and to come to UC. Pt reports being tested for Covid 2 days ago and was negative.

## 2019-01-25 NOTE — Discharge Instructions (Signed)
Take the antibiotic as prescribed 4 times a day for 10 days.    Follow-up with your OB/GYN or your primary care provider on Monday for a recheck.    Return here or go to the emergency department if you develop high fever, increased redness or pain in your breast, or other concerning symptoms.

## 2019-11-19 ENCOUNTER — Telehealth: Payer: Self-pay | Admitting: Hematology and Oncology

## 2019-11-19 NOTE — Telephone Encounter (Signed)
Received a new hem referral from [facility] for abnormal uterine and vaginal bleeding. Ms. Sheila Sexton has been cld and scheduled to see Dr. Leonides Schanz on 7/8 at 9am. Pt aware to arrive 15 minutes early.

## 2019-12-04 NOTE — Progress Notes (Signed)
[facility] [phone]   [phone]  INITIAL CONSULT NOTE  Patient Care Team: Larkin Ina as PCP - General (Physician Assistant) Waynard Reeds, MD as Consulting Physician (Obstetrics and Gynecology)  Hematological/Oncological History # Heavy Menstrual Bleeding 1) 11/07/2019: WBC 4.6, Hgb 14.6, MCV 90, Plt 279. PTT 30, INR 1.0, PT 11.1, FVIII activity 87%, vWF Antigen 62%, vWF activity 49%. Ferritin 66. 2) 12/05/2019: establish care with Dr. Leonides Schanz   CHIEF COMPLAINTS/PURPOSE OF CONSULTATION:  "Heavy Menstrual Bleeding "  HISTORY OF PRESENTING ILLNESS:  Caryl Asp 24 y.o. female with medical history significant for an ovarian cyst who presents for evaluation of heavy menstrual bleeding.   On review of the previous records Mrs. Bensman clinic visit with her OB/GYN on 11/07/2019.  At that time she had a CBC drawn which showed a white blood cell count of 4.6, hemoglobin 14.6, and a platelet count of 279.  Part of that evaluation she had a von Willebrand panel drawn which showed normal factor VIII activity, normal von Willebrand factor antigen, however von Willebrand factor activity was at 49% (50% is normal).  Her ferritin was noted at 66.  Due to concern for this patient's history of bleeding she was referred to hematology for further evaluation and management for a possible coagulopathy.  On exam today Ms. Yetta Barre notes that she feels quite well.  She reports that she recently switched OB/GYN physicians and after speaking with her new physician there was some concern about a possible bleeding disorder.  The patient reports that she did have nosebleeds during pregnancy and that she did occasionally have issues in high school with passing out.  She reports that she does not recall any time in her past where she had "excessive" bleeding and that she is undergone numerous surgical procedures without any bleeding complications.  She delivered a healthy child last  year without any bleeding complications in the perinatal period.  Additionally she underwent an exploratory laparoscopy in 2017 which confirmed the diagnosis of endometriosis and once again the patient did not have any issues with bleeding at that time.  She notes that she does have random bruising on mostly her arms and legs without clear explanation.  On further discussion she notes that she has not had any issues with spontaneous bleeding otherwise.  She notes that her menstrual cycles tended to be quite heavy before she was on an IUD.  She notes that she would go through 9-10 pads per day and that her periods would last 5 to 6 days and would occasionally irregular.  This corrected after she was put on an IUD 1 year prior to becoming pregnant with her child.  She notes that she is there has been concern for anemia in the past but she has never been placed on iron therapy and she also has a family history and personal history of thyroid disease.  She reports that she does not have any family history remarkable for a bleeding disorder and that she does not smoke or use any alcohol.  She works for [employer] at the [employer].  She currently denies having any issues with fevers, chills, sweats, nausea, vomiting or diarrhea.  A full 10 point ROS is listed below.  MEDICAL HISTORY:  Past Medical History:  Diagnosis Date  . Ovarian cyst   . Preeclampsia, third trimester 12/14/2018    SURGICAL HISTORY: Past Surgical History:  Procedure Laterality Date  . CYSTOSCOPY    . LAPAROSCOPY    .  WISDOM TOOTH EXTRACTION      SOCIAL HISTORY: Social History   Socioeconomic History  . Marital status: Married    Spouse name: Kimm Sider  . Number of children: Not on file  . Years of education: Not on file  . Highest education level: Not on file  Occupational History  . Occupation: Psychologist, sport and exercise  Tobacco Use  . Smoking status: Never Smoker  . Smokeless tobacco: Never Used  Vaping Use  .  Vaping Use: Never used  Substance and Sexual Activity  . Alcohol use: No  . Drug use: No  . Sexual activity: Yes  Other Topics Concern  . 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 Difficulty of Paying Living Expenses: Not hard at all  Food Insecurity: No Food Insecurity  . Worried About Programme researcher, broadcasting/film/video in the Last Year: Never true  . Ran Out of Food in the Last Year: Never true  Transportation Needs: No Transportation Needs  . Lack of Transportation (Medical): No  . Lack of Transportation (Non-Medical): No  Physical Activity: Inactive  . Days of Exercise per Week: 0 days  . Minutes of Exercise per Session: 0 min  Stress: No Stress Concern Present  . Feeling of Stress : Not at all  Social Connections: Moderately Integrated  . Frequency of Communication with Friends and Family: More than three times a week  . Frequency of Social Gatherings with Friends and Family: Twice a week  . Attends Religious Services: 1 to 4 times per year  . Active Member of Clubs or Organizations: No  . Attends Banker Meetings: Never  . Marital Status: Married  Catering manager Violence:   . Fear of Current or Ex-Partner:   . Emotionally Abused:   Marland Kitchen Physically Abused:   . Sexually Abused:     FAMILY HISTORY: Family History  Problem Relation Age of Onset  . Depression Mother   . Anxiety disorder Mother   . Healthy Sister   . Cancer Maternal Grandmother   . Healthy Sister     ALLERGIES:  has No Known Allergies.  MEDICATIONS:  Current Outpatient Medications  Medication Sig Dispense Refill  . estradiol (ESTRACE) 0.1 MG/GM vaginal cream estradiol 0.01% (0.1 mg/gram) vaginal cream  USE THIN LAYER IN VAGINA DAILY FOR 14 DAYS THEN TWICE WEEKLY AFTERWARDS    . Prenatal Vit-Fe Fumarate-FA (PRENATAL  MULTIVITAMIN) TABS tablet Take 1 tablet by mouth daily at 12 noon.    . sertraline (ZOLOFT) 100 MG tablet sertraline 100 mg tablet  TAKE ONE TABLET BY MOUTH DAILY.    Marland Kitchen acetaminophen (TYLENOL) 325 MG tablet Take 650 mg by mouth every 6 (six) hours as needed.    . Acidophilus Lactobacillus CAPS Take 1 tablet by mouth daily as needed. 30 capsule 0  . levonorgestrel (MIRENA, 52 MG,) 20 MCG/24HR IUD Mirena 20 mcg/24 hours (6 yrs) 52 mg intrauterine device  Take 1 device by intrauterine route.     No current facility-administered medications for this visit.    REVIEW OF SYSTEMS:   Constitutional: ( - ) fevers, ( - )  chills , ( - ) night sweats Eyes: ( - ) blurriness of vision, ( - ) double vision, ( - ) watery eyes Ears, nose, mouth, throat,  and face: ( - ) mucositis, ( - ) sore throat Respiratory: ( - ) cough, ( - ) dyspnea, ( - ) wheezes Cardiovascular: ( - ) palpitation, ( - ) chest discomfort, ( - ) lower extremity swelling Gastrointestinal:  ( - ) nausea, ( - ) heartburn, ( - ) change in bowel habits Skin: ( - ) abnormal skin rashes Lymphatics: ( - ) new lymphadenopathy, ( - ) easy bruising Neurological: ( - ) numbness, ( - ) tingling, ( - ) new weaknesses Behavioral/Psych: ( - ) mood change, ( - ) new changes  All other systems were reviewed with the patient and are negative.  PHYSICAL EXAMINATION: ECOG PERFORMANCE STATUS: 0 - Asymptomatic  Vitals:   12/05/19 0906  BP: 135/72  Pulse: (!) 101  Resp: 18  Temp: 98.2 F (36.8 C)  SpO2: 98%   Filed Weights   12/05/19 0906  Weight: 150 lb 4.8 oz (68.2 kg)    GENERAL: well appearing young Caucasian female in NAD  SKIN: skin color, texture, turgor are normal, no rashes or significant lesions EYES: conjunctiva are pink and non-injected, sclera clear LUNGS: clear to auscultation and percussion with normal breathing effort HEART: regular rate & rhythm and no murmurs and no lower extremity edema Musculoskeletal: no cyanosis of  digits and no clubbing  PSYCH: alert & oriented x 3, fluent speech NEURO: no focal motor/sensory deficits  LABORATORY DATA:  I have reviewed the data as listed CBC Latest Ref Rng & Units 12/05/2019 12/15/2018 12/14/2018  WBC 4.0 - 10.5 K/uL 4.3 12.5(H) 9.6  Hemoglobin 12.0 - 15.0 g/dL 58.0 9.9(I) 10.9(L)  Hematocrit 36 - 46 % 42.5 29.9(L) 33.7(L)  Platelets 150 - 400 K/uL 271 145(L) 163    CMP Latest Ref Rng & Units 12/05/2019 12/14/2018 11/13/2018  Glucose 70 - 99 mg/dL 77 71 338(S)  BUN 6 - 20 mg/dL 15 8 <5(K)  Creatinine 0.44 - 1.00 mg/dL 5.39 7.67 3.41  Sodium 135 - 145 mmol/L 142 137 137  Potassium 3.5 - 5.1 mmol/L 4.1 4.4 3.5  Chloride 98 - 111 mmol/L 107 107 109  CO2 22 - 32 mmol/L 26 20(L) 21(L)  Calcium 8.9 - 10.3 mg/dL 9.7 9.3 8.9  Total Protein 6.5 - 8.1 g/dL 7.7 6.0(L) 5.5(L)  Total Bilirubin 0.3 - 1.2 mg/dL 0.5 9.3(X) 0.3  Alkaline Phos 38 - 126 U/L 103 193(H) 131(H)  AST 15 - 41 U/L 13(L) 23 16  ALT 0 - 44 U/L 14 13 12     ASSESSMENT & PLAN 24 y.o. female with medical history significant for an ovarian cyst who presents for evaluation of heavy menstrual bleeding.  After review the labs, review the records, discussion with the patient the findings are not concerning at this time for a bleeding disorder.  The patient may be slightly more prone to bleeding than the average individual, however there are no indications based on her prior surgeries, prior procedures, and medical history that would imply that she has a bleeding disorder.  He has never been demonstrated to have issues with invasive procedures and has never bled to the point where she required transfusion or iron supplementation.  Overall her clinical history is reassuring that there is no bleeding disorder.  In regards the patient's von Willebrand panel her von Willebrand factor activity was at 49% which is just barely below the reference range of the laboratory.  Her other studies were within normal limits  and she has a normal PT, PTT, and  INR.  Out of an abundance of caution and her proclivity towards minor bleeds we will proceed with some additional testing to include thrombin time, platelet function assay, and fibrinogen level.  If the studies are within normal limits I do not think any further hypercoagulation work-up will be required.  # Concern for Bleeding Disorder --at this time there are no concerning findings for a bleeding disorder.  --vWD panel returned with vWF activity at 49%. This is not likely to be clinically relevant. Other studies within that panel were WNL.  --will evaluate for other possible causes today with a PT/INR, PTT, thrombin time, and fibrinogen level --additionally will perform a platelet function assay  --if none of the above studies are revealing, no further workup would be required.   Orders Placed This Encounter  Procedures  . CBC with Differential (Cancer Center Only)    Standing Status:   Future    Number of Occurrences:   1    Standing Expiration Date:   12/04/2020  . CMP (Cancer Center only)    Standing Status:   Future    Number of Occurrences:   1    Standing Expiration Date:   12/04/2020  . Protime-INR    Standing Status:   Future    Number of Occurrences:   1    Standing Expiration Date:   12/04/2020  . APTT    Standing Status:   Future    Number of Occurrences:   1    Standing Expiration Date:   12/04/2020  . Platelet function assay    Standing Status:   Future    Number of Occurrences:   1    Standing Expiration Date:   12/04/2020  . Fibrinogen    Standing Status:   Future    Number of Occurrences:   1    Standing Expiration Date:   12/04/2020  . Thrombin time    Standing Status:   Future    Number of Occurrences:   1    Standing Expiration Date:   12/04/2020    All questions were answered. The patient knows to call the clinic with any problems, questions or concerns.  A total of more than 45 minutes were spent on this encounter and over half of  that time was spent on counseling and coordination of care as outlined above.   Ulysees BarnsJohn T. Talajah Slimp, MD Department of Hematology/Oncology [facility] Phone: [phone] Pager: [phone] Email: [email or web address]  12/05/2019 10:47 AM

## 2019-12-05 ENCOUNTER — Other Ambulatory Visit: Payer: Self-pay

## 2019-12-05 ENCOUNTER — Inpatient Hospital Stay: Payer: 59 | Attending: Hematology and Oncology | Admitting: Hematology and Oncology

## 2019-12-05 ENCOUNTER — Encounter: Payer: Self-pay | Admitting: Hematology and Oncology

## 2019-12-05 ENCOUNTER — Inpatient Hospital Stay: Payer: 59

## 2019-12-05 VITALS — BP 135/72 | HR 101 | Temp 98.2°F | Resp 18 | Ht 65.0 in | Wt 150.3 lb

## 2019-12-05 DIAGNOSIS — D699 Hemorrhagic condition, unspecified: Secondary | ICD-10-CM

## 2019-12-05 DIAGNOSIS — N83209 Unspecified ovarian cyst, unspecified side: Secondary | ICD-10-CM | POA: Diagnosis not present

## 2019-12-05 DIAGNOSIS — Z809 Family history of malignant neoplasm, unspecified: Secondary | ICD-10-CM | POA: Insufficient documentation

## 2019-12-05 DIAGNOSIS — D680 Von Willebrand's disease: Secondary | ICD-10-CM

## 2019-12-05 DIAGNOSIS — N920 Excessive and frequent menstruation with regular cycle: Secondary | ICD-10-CM | POA: Diagnosis not present

## 2019-12-05 LAB — CMP (CANCER CENTER ONLY)
ALT: 14 U/L (ref 0–44)
AST: 13 U/L — ABNORMAL LOW (ref 15–41)
Albumin: 4.4 g/dL (ref 3.5–5.0)
Alkaline Phosphatase: 103 U/L (ref 38–126)
Anion gap: 9 (ref 5–15)
BUN: 15 mg/dL (ref 6–20)
CO2: 26 mmol/L (ref 22–32)
Calcium: 9.7 mg/dL (ref 8.9–10.3)
Chloride: 107 mmol/L (ref 98–111)
Creatinine: 0.77 mg/dL (ref 0.44–1.00)
GFR, Est AFR Am: >60 mL/min (ref >60)
GFR, Estimated: >60 mL/min (ref >60)
Glucose, Bld: 77 mg/dL (ref 70–99)
Potassium: 4.1 mmol/L (ref 3.5–5.1)
Sodium: 142 mmol/L (ref 135–145)
Total Bilirubin: 0.5 mg/dL (ref 0.3–1.2)
Total Protein: 7.7 g/dL (ref 6.5–8.1)

## 2019-12-05 LAB — CBC WITH DIFFERENTIAL (CANCER CENTER ONLY)
Abs Immature Granulocytes: 0.01 10*3/uL (ref 0.00–0.07)
Basophils Absolute: 0 10*3/uL (ref 0.0–0.1)
Basophils Relative: 1 %
Eosinophils Absolute: 0.1 10*3/uL (ref 0.0–0.5)
Eosinophils Relative: 3 %
HCT: 42.5 % (ref 36.0–46.0)
Hemoglobin: 14.3 g/dL (ref 12.0–15.0)
Immature Granulocytes: 0 %
Lymphocytes Relative: 41 %
Lymphs Abs: 1.7 10*3/uL (ref 0.7–4.0)
MCH: 30.4 pg (ref 26.0–34.0)
MCHC: 33.6 g/dL (ref 30.0–36.0)
MCV: 90.2 fL (ref 80.0–100.0)
Monocytes Absolute: 0.2 10*3/uL (ref 0.1–1.0)
Monocytes Relative: 5 %
Neutro Abs: 2.2 10*3/uL (ref 1.7–7.7)
Neutrophils Relative %: 50 %
Platelet Count: 271 10*3/uL (ref 150–400)
RBC: 4.71 MIL/uL (ref 3.87–5.11)
RDW: 12.7 % (ref 11.5–15.5)
WBC Count: 4.3 10*3/uL (ref 4.0–10.5)
nRBC: 0 % (ref 0.0–0.2)

## 2019-12-05 LAB — PLATELET FUNCTION ASSAY
Collagen / ADP: 122 seconds — ABNORMAL HIGH (ref 0–118)
Collagen / Epinephrine: 251 seconds — ABNORMAL HIGH (ref 0–193)

## 2019-12-05 LAB — APTT: aPTT: 29 seconds (ref 24–36)

## 2019-12-05 LAB — FIBRINOGEN: Fibrinogen: 335 mg/dL (ref 210–475)

## 2019-12-05 LAB — PROTIME-INR
INR: 1 (ref 0.8–1.2)
Prothrombin Time: 13 seconds (ref 11.4–15.2)

## 2019-12-06 ENCOUNTER — Encounter: Payer: Self-pay | Admitting: Hematology and Oncology

## 2019-12-06 LAB — THROMBIN TIME: Thrombin Time: 17.4 s (ref 0.0–23.0)

## 2020-05-30 NOTE — L&D Delivery Note (Signed)
DELIVERY NOTE  Patient progressed rapidly from 5cm to complete with strong urge to push. SROM occurred during crowning. Pt pushed and delivered a viable female infant in ROA position, body cord delivered through. Anterior and posterior shoulders spontaneously delivered with next two pushes; body easily followed next. Infant placed on mothers abdomen and bulb suction of mouth and nose performed. I presented to room at this time given speed of delivery. Cord was then clamped and cut by FOB. Cord blood obtained, 3VC. Baby had a vigorous spontaneous cry noted. Placenta then delivered at 1055 intact. Fundal massage performed and pitocin per protocol. Fundus firm. The following lacerations were noted: NONE. Mother and baby stable. Counts correct   Infant time: 31 Gender: female, desires circ Placenta time: 1055 Apgars: 9/9 Weight: pending skin-to-skin

## 2020-09-01 LAB — OB RESULTS CONSOLE RUBELLA ANTIBODY, IGM: Rubella: IMMUNE

## 2020-09-01 LAB — OB RESULTS CONSOLE RPR: RPR: NONREACTIVE

## 2020-09-01 LAB — OB RESULTS CONSOLE HEPATITIS B SURFACE ANTIGEN: Hepatitis B Surface Ag: NEGATIVE

## 2020-09-01 LAB — HM PAP SMEAR

## 2020-09-01 LAB — OB RESULTS CONSOLE GC/CHLAMYDIA
Chlamydia: NEGATIVE
Gonorrhea: NEGATIVE

## 2020-09-01 LAB — OB RESULTS CONSOLE ABO/RH: RH Type: POSITIVE

## 2020-09-01 LAB — OB RESULTS CONSOLE HIV ANTIBODY (ROUTINE TESTING): HIV: NONREACTIVE

## 2020-09-01 LAB — OB RESULTS CONSOLE ANTIBODY SCREEN: Antibody Screen: NEGATIVE

## 2020-09-03 ENCOUNTER — Other Ambulatory Visit: Payer: Self-pay

## 2020-09-08 ENCOUNTER — Encounter: Payer: Self-pay | Admitting: *Deleted

## 2020-09-10 ENCOUNTER — Other Ambulatory Visit: Payer: Self-pay

## 2020-09-10 ENCOUNTER — Ambulatory Visit: Payer: 59 | Attending: Obstetrics and Gynecology | Admitting: Genetic Counselor

## 2020-09-10 DIAGNOSIS — D582 Other hemoglobinopathies: Secondary | ICD-10-CM

## 2020-09-10 DIAGNOSIS — O280 Abnormal hematological finding on antenatal screening of mother: Secondary | ICD-10-CM | POA: Diagnosis not present

## 2020-09-10 DIAGNOSIS — Z315 Encounter for genetic counseling: Secondary | ICD-10-CM

## 2020-09-10 NOTE — Progress Notes (Signed)
09/10/2020  Caryl Asp 1996/01/20 MRN: [id number] DOV: 09/10/2020  Ms. Foucher presented to the [facility] for Maternal Fetal [facility] for a genetics consultation regarding her abnormal hemoglobin electrophoresis results. Ms. Noori was accompanied to her appointment by her mother.   Indication for genetic counseling - Increased fetal hemoglobin on hemoglobin electrophoresis  Prenatal history  Ms. Co is a [id number], 25 y.o. female. Her current pregnancy has completed [redacted]w[redacted]d (Estimated Date of Delivery: 04/01/21). Ms. Petrovich and her husband have a 24 month old son together.   Ms. Delange denied exposure to environmental toxins or chemical agents. She denied the use of alcohol, tobacco or street drugs. She reported taking Sertraline and prenatal vitamins. She denied significant viral illnesses, fevers, and bleeding during the course of her pregnancy. She reported a personal history of anxiety. Her medical and surgical histories were otherwise noncontributory.  Family History  A three generation pedigree was drafted and reviewed. The family history is remarkable for the following:  - Ms. Wisdom's maternal aunt has a daughter with autism. This child was born extremely premature and had an intraventricular hemorrhage. We discussed that individuals who are born prematurely are more likely to be diagnosed with autism than infants who were born full term. Intraventricular hemorrhages are also associated with prematurity.  The remaining family histories were reviewed and found to be noncontributory for birth defects, intellectual disability, recurrent pregnancy loss, and known genetic conditions.    The patient's ancestry is [place], [place], and [place]. The father of the pregnancy's ancestry is Caucasian and Native American ([place]). Ashkenazi Jewish ancestry and consanguinity were denied. Pedigree will be scanned under Media.  Discussion  Hemoglobin electrophoresis results:  Ms. Bubar was  referred for genetic counseling due to her abnormal hemoglobin electrophoresis results. On hemoglobin electrophoresis, Ms. Ange level of adult hemoglobin (HbA) was slightly decreased (95.0, normal range: 96.4-98.8) and her level of fetal hemoglobin (HbF) was slightly increased (2.5, normal range: 0.0-2.0). We reviewed this finding in detail.  We discussed that hemoglobin is a protein in the blood that transports oxygen from the lungs to organs and tissues throughout the body. Hemoglobin electrophoresis evaluates the presence of various forms of hemoglobin in an individual. Typically, individuals are expected to produce mostly adult hemoglobin (HbA). Some individuals produce atypical forms of hemoglobin such as sickle hemoglobin (HbS) due to abnormalities in the HBB gene. Fetal hemoglobin (HbF) is the main type of hemoglobin found in the red blood cells of fetuses. HbF is involved in transporting oxygen from the mother's bloodstream to organs and tissues in the fetus.  We reviewed that typically, HbF is almost completely replaced by HbA by 43-4 months of age. Increased levels of HbF that persist after this point postnatally can be associated with many things. Some individuals have hereditary persistent of fetal hemoglobin (HPFH) due to deletions in the HBB gene. HPFH is a benign condition and individuals are asymptomatic. Elevated HbF can also be indicative of a blood disorder in an individual, such as beta-thalassemia, sickle cell disease, or leukemia. Finally, increased HbF levels can occur secondary to pregnancy or bone marrow regeneration following chemotherapy, stem cell transplants, or acute blood loss.  Ms. Klindt was counseled that other values in an individual's heme profile and the presence of other abnormal hemoglobins on hemoglobin electrophoresis can provide further information as to the most likely cause of an elevated HbF level. Given that she had normal red blood cell indices on CBC and normal  levels of hemoglobin A2 on hemoglobin electrophoresis, it  is likely that her elevated HbF is related to primary HPFH or her current pregnancy status. We discussed that increases in HbF during pregnancy are normal. However, if Ms. Cygan would like to determine if her elevated HbF level is due to HPFH, she may consider testing of the HBB gene. Ms. Arguijo declined HBB gene testing at this time.  Screening/testing options:  We also reviewed other screening/testing options that are available during pregnancy. We briefly discussed noninvasive prenatal screening (NIPS) as an available screening option for chromosomal aneuploidies, carrier screening for the remaining ACOG-recommended conditions (cystic fibrosis and spinal muscular atrophy), and the option of diagnostic testing. Ms. Elderkin declined all further screening and testing at this time. She informed me that an abnormal result on any of the above tests would not make a difference in how she manages the pregnancy. She prefers to proceed with monitoring the pregnancy via routine ultrasounds only.  Plan:  HBB gene testing and additional screening/diagnostic testing were declined today. Ms. Pidcock understands that screening tests, including ultrasound, cannot rule out all birth defects or genetic syndromes. She was advised of this limitation and states she still does not want additional testing or screening at this time.   I counseled Ms. Kauth regarding the above risks and available options. The approximate face-to-face time with the genetic counselor was 20 minutes.  In summary:  Discussed hemoglobin electrophoresis results  Slightly increased level of fetal hemoglobin & slightly decreased level of adult hemoglobin  Discussed possible causes of elevated fetal hemoglobin  Hereditary persistence of fetal hemoglobin or current pregnancy status likely explanation based on other hematological values  Declined HBB gene testing to determine if she has  hereditary persistence of fetal hemoglobin  Offered additional testing and screening  Declined NIPS, carrier screening for cystic fibrosis and spinal muscular atrophy, and diagnostic testing  Reviewed family history concerns   Gershon Crane, MS, Aeronautical engineer

## 2020-09-22 ENCOUNTER — Ambulatory Visit: Payer: Self-pay

## 2020-11-19 ENCOUNTER — Other Ambulatory Visit: Payer: Self-pay | Admitting: Obstetrics and Gynecology

## 2020-11-19 ENCOUNTER — Other Ambulatory Visit: Payer: Self-pay

## 2020-11-19 ENCOUNTER — Encounter (HOSPITAL_COMMUNITY): Payer: Self-pay | Admitting: Obstetrics and Gynecology

## 2020-11-19 ENCOUNTER — Inpatient Hospital Stay (HOSPITAL_COMMUNITY)
Admission: AD | Admit: 2020-11-19 | Discharge: 2020-11-19 | Disposition: A | Discharge status: Home / Self Care | Payer: 59 | Attending: Obstetrics and Gynecology | Admitting: Obstetrics and Gynecology

## 2020-11-19 DIAGNOSIS — Z79899 Other long term (current) drug therapy: Secondary | ICD-10-CM | POA: Insufficient documentation

## 2020-11-19 DIAGNOSIS — O26892 Other specified pregnancy related conditions, second trimester: Secondary | ICD-10-CM | POA: Diagnosis not present

## 2020-11-19 DIAGNOSIS — Z3A20 20 weeks gestation of pregnancy: Secondary | ICD-10-CM

## 2020-11-19 DIAGNOSIS — O99891 Other specified diseases and conditions complicating pregnancy: Secondary | ICD-10-CM | POA: Diagnosis not present

## 2020-11-19 DIAGNOSIS — R55 Syncope and collapse: Secondary | ICD-10-CM | POA: Insufficient documentation

## 2020-11-19 HISTORY — DX: Urinary tract infection, site not specified: N39.0

## 2020-11-19 HISTORY — DX: Anxiety disorder, unspecified: F41.9

## 2020-11-19 HISTORY — DX: Depression, unspecified: F32.A

## 2020-11-19 HISTORY — DX: Headache, unspecified: R51.9

## 2020-11-19 LAB — URINALYSIS, ROUTINE W REFLEX MICROSCOPIC
Bilirubin Urine: NEGATIVE
Glucose, UA: 150 mg/dL — AB
Hgb urine dipstick: NEGATIVE
Ketones, ur: 5 mg/dL — AB
Nitrite: NEGATIVE
Protein, ur: NEGATIVE mg/dL
Specific Gravity, Urine: 1.028 (ref 1.005–1.030)
pH: 6 (ref 5.0–8.0)

## 2020-11-19 LAB — TSH: TSH: 0.74 u[IU]/mL (ref 0.350–4.500)

## 2020-11-19 LAB — CBC WITH DIFFERENTIAL/PLATELET
Abs Immature Granulocytes: 0.08 10*3/uL — ABNORMAL HIGH (ref 0.00–0.07)
Basophils Absolute: 0 10*3/uL (ref 0.0–0.1)
Basophils Relative: 0 %
Eosinophils Absolute: 0.2 10*3/uL (ref 0.0–0.5)
Eosinophils Relative: 2 %
HCT: 37.2 % (ref 36.0–46.0)
Hemoglobin: 12.7 g/dL (ref 12.0–15.0)
Immature Granulocytes: 1 %
Lymphocytes Relative: 20 %
Lymphs Abs: 1.7 10*3/uL (ref 0.7–4.0)
MCH: 31.2 pg (ref 26.0–34.0)
MCHC: 34.1 g/dL (ref 30.0–36.0)
MCV: 91.4 fL (ref 80.0–100.0)
Monocytes Absolute: 0.4 10*3/uL (ref 0.1–1.0)
Monocytes Relative: 4 %
Neutro Abs: 6.1 10*3/uL (ref 1.7–7.7)
Neutrophils Relative %: 73 %
Platelets: 204 10*3/uL (ref 150–400)
RBC: 4.07 MIL/uL (ref 3.87–5.11)
RDW: 13.2 % (ref 11.5–15.5)
WBC: 8.4 10*3/uL (ref 4.0–10.5)
nRBC: 0 % (ref 0.0–0.2)

## 2020-11-19 LAB — GLUCOSE, CAPILLARY
Glucose-Capillary: 84 mg/dL (ref 70–99)
Glucose-Capillary: 76 mg/dL (ref 70–99)

## 2020-11-19 MED ORDER — LACTATED RINGERS IV BOLUS
1000.0000 mL | Freq: Once | INTRAVENOUS | Status: AC
  Administered 2020-11-19: 1000 mL via INTRAVENOUS

## 2020-11-19 NOTE — MAU Provider Note (Signed)
History     CSN: [id number]  Arrival date and time: 11/19/20 0947   Event Date/Time   First Provider Initiated Contact with Patient 11/19/20 1116      Chief Complaint  Patient presents with   Loss of Consciousness   HPI  Sheila Sexton is a 25 y.o. female G2P1001 @ [redacted]w[redacted]d here in MAU following a syncope that occurred just prior to her arrival. She reports she passed out 9:45 this AM while working. The syncope was witnessed. At the time she was in a patient room, she became hot, clammy and felt her vision changing. She went out to the nurses station and grabbed her co-workers arm and everything went black. She did not hit her head or her belly. She has no dizziness, chest pain or SOB at this time.   Her Breakfast consistent of: bagel cream cheese, eggs, yogurt @ 0530   OB History     Gravida  2   Para  1   Term  1   Preterm      AB      Living  1      SAB      IAB      Ectopic      Multiple      Live Births  1           Past Medical History:  Diagnosis Date   Anxiety    Depression    meds are working, doing well   Headache    Ovarian cyst    Preeclampsia, third trimester 12/14/2018   UTI (urinary tract infection)     Past Surgical History:  Procedure Laterality Date   CYSTOSCOPY     LAPAROSCOPY     WISDOM TOOTH EXTRACTION      Family History  Problem Relation Age of Onset   Depression Mother    Anxiety disorder Mother    COPD Father    Hypertension Father    Healthy Sister    Healthy Sister    Cancer Maternal Grandmother     Social History   Tobacco Use   Smoking status: Never   Smokeless tobacco: Never  Vaping Use   Vaping Use: Never used  Substance Use Topics   Alcohol use: No   Drug use: No    Allergies: No Known Allergies  Medications Prior to Admission  Medication Sig Dispense Refill Last Dose   acetaminophen (TYLENOL) 325 MG tablet Take 650 mg by mouth every 6 (six) hours as needed.   Past Month    butalbital-acetaminophen-caffeine (FIORICET) 50-325-40 MG tablet Take 1 tablet by mouth every 6 (six) hours as needed for headache.   Past Month   Prenatal Vit-Fe Fumarate-FA (PRENATAL MULTIVITAMIN) TABS tablet Take 1 tablet by mouth daily at 12 noon.   11/18/2020   sertraline (ZOLOFT) 100 MG tablet sertraline 100 mg tablet  TAKE ONE TABLET BY MOUTH DAILY.   11/18/2020   Acidophilus Lactobacillus CAPS Take 1 tablet by mouth daily as needed. 30 capsule 0    estradiol (ESTRACE) 0.1 MG/GM vaginal cream estradiol 0.01% (0.1 mg/gram) vaginal cream  USE THIN LAYER IN VAGINA DAILY FOR 14 DAYS THEN TWICE WEEKLY AFTERWARDS      levonorgestrel (MIRENA, 52 MG,) 20 MCG/24HR IUD Mirena 20 mcg/24 hours (6 yrs) 52 mg intrauterine device  Take 1 device by intrauterine route.      Results for orders placed or performed during the hospital encounter of 11/19/20 (from the past 48 hour(s))  Urinalysis, Routine  w reflex microscopic     Status: Abnormal   Collection Time: 11/19/20  9:56 AM  Result Value Ref Range   Color, Urine YELLOW YELLOW   APPearance CLOUDY (A) CLEAR   Specific Gravity, Urine 1.028 1.005 - 1.030   pH 6.0 5.0 - 8.0   Glucose, UA 150 (A) NEGATIVE mg/dL   Hgb urine dipstick NEGATIVE NEGATIVE   Bilirubin Urine NEGATIVE NEGATIVE   Ketones, ur 5 (A) NEGATIVE mg/dL   Protein, ur NEGATIVE NEGATIVE mg/dL   Nitrite NEGATIVE NEGATIVE   Leukocytes,Ua TRACE (A) NEGATIVE   RBC / HPF 0-5 0 - 5 RBC/hpf   WBC, UA 0-5 0 - 5 WBC/hpf   Bacteria, UA RARE (A) NONE SEEN   Squamous Epithelial / LPF 21-50 0 - 5   Mucus PRESENT     Comment: Performed at [facility], [address] [place], [place] [address]  CBC with Differential/Platelet     Status: Abnormal   Collection Time: 11/19/20 10:05 AM  Result Value Ref Range   WBC 8.4 4.0 - 10.5 K/uL   RBC 4.07 3.87 - 5.11 MIL/uL   Hemoglobin 12.7 12.0 - 15.0 g/dL   HCT 23.7 62.8 - 31.5 %   MCV 91.4 80.0 - 100.0 fL   MCH 31.2 26.0 - 34.0 pg   MCHC 34.1  30.0 - 36.0 g/dL   RDW 17.6 16.0 - 73.7 %   Platelets 204 150 - 400 K/uL   nRBC 0.0 0.0 - 0.2 %   Neutrophils Relative % 73 %   Neutro Abs 6.1 1.7 - 7.7 K/uL   Lymphocytes Relative 20 %   Lymphs Abs 1.7 0.7 - 4.0 K/uL   Monocytes Relative 4 %   Monocytes Absolute 0.4 0.1 - 1.0 K/uL   Eosinophils Relative 2 %   Eosinophils Absolute 0.2 0.0 - 0.5 K/uL   Basophils Relative 0 %   Basophils Absolute 0.0 0.0 - 0.1 K/uL   Immature Granulocytes 1 %   Abs Immature Granulocytes 0.08 (H) 0.00 - 0.07 K/uL    Comment: Performed at [facility], [address] [place], [place] [address]  Glucose, capillary     Status: None   Collection Time: 11/19/20 11:22 AM  Result Value Ref Range   Glucose-Capillary 84 70 - 99 mg/dL    Comment: Glucose reference range applies only to samples taken after fasting for at least 8 hours.  TSH     Status: None   Collection Time: 11/19/20 11:53 AM  Result Value Ref Range   TSH 0.740 0.350 - 4.500 uIU/mL    Comment: Performed by a 3rd Generation assay with a functional sensitivity of <=0.01 uIU/mL. Performed at [facility], [address], [place], [place] [address]   Glucose, capillary     Status: None   Collection Time: 11/19/20 12:35 PM  Result Value Ref Range   Glucose-Capillary 76 70 - 99 mg/dL    Comment: Glucose reference range applies only to samples taken after fasting for at least 8 hours.    Review of Systems  Respiratory:  Negative for shortness of breath.   Cardiovascular:  Negative for chest pain.  Neurological:  Positive for syncope, weakness and light-headedness. Negative for facial asymmetry, numbness and headaches.  Physical Exam   Blood pressure 128/65, pulse (!) 116, temperature 98.1 F (36.7 C), temperature source Oral, resp. rate 20, last menstrual period 06/25/2020, SpO2 100 %, unknown if currently breastfeeding.  Physical Exam Constitutional:  General: She is not in acute distress.    Appearance: Normal  appearance. She is not ill-appearing, toxic-appearing or diaphoretic.  HENT:     Head: Normocephalic.  Cardiovascular:     Rate and Rhythm: Regular rhythm. Tachycardia present.     Heart sounds: Normal heart sounds. No murmur heard. Pulmonary:     Effort: Pulmonary effort is normal.  Skin:    General: Skin is warm.  Neurological:     Mental Status: She is alert and oriented to person, place, and time.  Psychiatric:        Behavior: Behavior normal.   MAU Course  Procedures None  MDM  + orthostatic vital signs Lr bolus x 1, patient ordered a meal tray and ate while in MAU. CBG:  84 prior to meal Hgb level normal  Orthostatic vitals signs improved post fluid and meal, however patient continued to report shakiness while standing for vitals. Discussed patient with on- call cardiology Keitha Butte, he reviewed EKG and vitals. Ok for outpatient referral to cardiology.  Spoke to Dr. Senaida Ores for cardiology consult.   Assessment and Plan   A:  1. Vasovagal syncope   2. [redacted] weeks gestation of pregnancy      P:  Discharge home in stable condition  Increase small meals Increase oral fluid hydration Small amounts of added salt is ok Cardiology consult made by OB Return to MAU if symptoms worsen   Ashtan Laton, Harolyn Rutherford, NP 11/19/2020 5:47 PM

## 2020-11-19 NOTE — MAU Note (Addendum)
Was standing in a rm, started to get really hot, things started blurring, she walked out to the desk, everything went dark. Co-workers had caught her and lowered her to the ground, did not hit anything.  (Report out approx 30sec). They brought her down in a wc.

## 2020-11-20 ENCOUNTER — Telehealth: Payer: Self-pay

## 2020-11-20 NOTE — Telephone Encounter (Signed)
Referral notes sent from [facility], Phone #: [phone], Fax #: [phone]   Notes sent to scheduling

## 2020-11-27 ENCOUNTER — Ambulatory Visit: Payer: 59 | Admitting: Cardiology

## 2020-12-24 DIAGNOSIS — R9431 Abnormal electrocardiogram [ECG] [EKG]: Secondary | ICD-10-CM | POA: Insufficient documentation

## 2020-12-24 DIAGNOSIS — R55 Syncope and collapse: Secondary | ICD-10-CM | POA: Insufficient documentation

## 2021-01-01 ENCOUNTER — Ambulatory Visit: Payer: 59 | Admitting: Cardiovascular Disease

## 2021-01-07 DIAGNOSIS — Z23 Encounter for immunization: Secondary | ICD-10-CM | POA: Diagnosis not present

## 2021-01-07 DIAGNOSIS — Z3A27 27 weeks gestation of pregnancy: Secondary | ICD-10-CM | POA: Diagnosis not present

## 2021-01-07 DIAGNOSIS — Z3483 Encounter for supervision of other normal pregnancy, third trimester: Secondary | ICD-10-CM | POA: Diagnosis not present

## 2021-01-07 DIAGNOSIS — Z3689 Encounter for other specified antenatal screening: Secondary | ICD-10-CM | POA: Diagnosis not present

## 2021-01-13 ENCOUNTER — Inpatient Hospital Stay (HOSPITAL_COMMUNITY)
Admission: AD | Admit: 2021-01-13 | Discharge: 2021-01-13 | Disposition: A | Discharge status: Home / Self Care | Payer: 59 | Attending: Obstetrics and Gynecology | Admitting: Obstetrics and Gynecology

## 2021-01-13 ENCOUNTER — Encounter (HOSPITAL_COMMUNITY): Payer: Self-pay | Admitting: Obstetrics and Gynecology

## 2021-01-13 DIAGNOSIS — R102 Pelvic and perineal pain: Secondary | ICD-10-CM | POA: Diagnosis not present

## 2021-01-13 DIAGNOSIS — O99891 Other specified diseases and conditions complicating pregnancy: Secondary | ICD-10-CM | POA: Diagnosis not present

## 2021-01-13 DIAGNOSIS — N949 Unspecified condition associated with female genital organs and menstrual cycle: Secondary | ICD-10-CM

## 2021-01-13 DIAGNOSIS — Z3A28 28 weeks gestation of pregnancy: Secondary | ICD-10-CM

## 2021-01-13 DIAGNOSIS — Z7982 Long term (current) use of aspirin: Secondary | ICD-10-CM | POA: Insufficient documentation

## 2021-01-13 DIAGNOSIS — O26893 Other specified pregnancy related conditions, third trimester: Secondary | ICD-10-CM | POA: Insufficient documentation

## 2021-01-13 LAB — URINALYSIS, ROUTINE W REFLEX MICROSCOPIC
Bilirubin Urine: NEGATIVE
Glucose, UA: NEGATIVE mg/dL
Hgb urine dipstick: NEGATIVE
Ketones, ur: NEGATIVE mg/dL
Leukocytes,Ua: NEGATIVE
Nitrite: NEGATIVE
Protein, ur: NEGATIVE mg/dL
Specific Gravity, Urine: 1.004 — ABNORMAL LOW (ref 1.005–1.030)
pH: 7 (ref 5.0–8.0)

## 2021-01-13 LAB — WET PREP, GENITAL
Clue Cells Wet Prep HPF POC: NONE SEEN
Sperm: NONE SEEN
Trich, Wet Prep: NONE SEEN
Yeast Wet Prep HPF POC: NONE SEEN

## 2021-01-13 NOTE — MAU Provider Note (Signed)
History     CSN: [id number]  Arrival date and time: 01/13/21 1728   Event Date/Time   First Provider Initiated Contact with Patient 01/13/21 1835      Chief Complaint  Patient presents with   pelvic pressure   Decreased Fetal Movement   Ms. Sheila Sexton is a 25 y.o. year old G45P1001 female at [redacted]w[redacted]d weeks gestation who presents to MAU reporting the last time she felt FM was about 3 hrs prior to arriving to MAU. She now is feeling "lots of FM." She complains of increased pelvic pressure; "like the baby is so heavy." She also reports 5 episodes of diarrhea last night. No N/V. She ate pork chops for dinner. She doesn't think that has anything to do with it. She had spotting last night, but none now. Her last SI was 5 days ago. 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 She receives [facility] at [facility].   OB History     Gravida  2   Para  1   Term  1   Preterm      AB      Living  1      SAB      IAB      Ectopic      Multiple      Live Births  1           Past Medical History:  Diagnosis Date   Anxiety    Depression    meds are working, doing well   Headache    Ovarian cyst    Preeclampsia, third trimester 12/14/2018   UTI (urinary tract infection)     Past Surgical History:  Procedure Laterality Date   CYSTOSCOPY     LAPAROSCOPY     WISDOM TOOTH EXTRACTION      Family History  Problem Relation Age of Onset   Depression Mother    Anxiety disorder Mother    COPD Father    Hypertension Father    Healthy Sister    Healthy Sister    Cancer Maternal Grandmother     Social History   Tobacco Use   Smoking status: Never   Smokeless tobacco: Never  Vaping Use   Vaping Use: Never used  Substance Use Topics   Alcohol use: No   Drug use: No    Allergies: No Known Allergies  Medications Prior to Admission  Medication Sig Dispense Refill Last Dose   acetaminophen (TYLENOL) 325 MG tablet Take 650 mg by mouth every 6 (six) hours as needed.   Past Month    aspirin 81 MG chewable tablet Chew by mouth daily.   01/13/2021   Prenatal Vit-Fe Fumarate-FA (PRENATAL MULTIVITAMIN) TABS tablet Take 1 tablet by mouth daily at 12 noon.   01/12/2021   sertraline (ZOLOFT) 100 MG tablet sertraline 100 mg tablet  TAKE ONE TABLET BY MOUTH DAILY.   01/12/2021   Acidophilus Lactobacillus CAPS Take 1 tablet by mouth daily as needed. 30 capsule 0    butalbital-acetaminophen-caffeine (FIORICET) 50-325-40 MG tablet Take 1 tablet by mouth every 6 (six) hours as needed for headache.   More than a month    Review of Systems  Constitutional: Negative.   HENT: Negative.    Eyes: Negative.   Respiratory: Negative.    Cardiovascular: Negative.   Gastrointestinal: Negative.   Endocrine: Negative.   Genitourinary:  Positive for pelvic pain (increased pelvic pressure).       DFM  Musculoskeletal: Negative.   Skin: Negative.  Allergic/Immunologic: Negative.   Neurological: Negative.   Hematological: Negative.   Psychiatric/Behavioral: Negative.    Physical Exam   Blood pressure 128/68, pulse 88, temperature 98.6 F (37 C), temperature source Oral, resp. rate 17, last menstrual period 06/25/2020, SpO2 99 %, unknown if currently breastfeeding.  Physical Exam Vitals and nursing note reviewed. Exam conducted with a chaperone present.  Constitutional:      Appearance: Normal appearance. She is obese.  Cardiovascular:     Rate and Rhythm: Normal rate.  Pulmonary:     Effort: Pulmonary effort is normal.  Abdominal:     Palpations: Abdomen is soft.  Genitourinary:    General: Normal vulva.     Comments: Pelvic exam: External genitalia normal, SE: vaginal walls pink and well rugated, cervix is smooth, pink, no lesions, small amt of clear-white, mucoid vaginal d/c -- WP, GC/CT done, cervix closed/long/very soft, Uterus is non-tender, S=D, no CMT or friability, no adnexal tenderness.  Musculoskeletal:        General: Normal range of motion.  Skin:    General: Skin is  warm and dry.  Neurological:     Mental Status: She is alert and oriented to person, place, and time.  Psychiatric:        Mood and Affect: Mood normal.        Behavior: Behavior normal.        Thought Content: Thought content normal.        Judgment: Judgment normal.    MAU Course  Procedures  MDM CCUA Wet Prep GC/CT -- Results pending   Results for orders placed or performed during the hospital encounter of 01/13/21 (from the past 24 hour(s))  Urinalysis, Routine w reflex microscopic Urine, Clean Catch     Status: Abnormal   Collection Time: 01/13/21  6:00 PM  Result Value Ref Range   Color, Urine STRAW (A) YELLOW   APPearance CLEAR CLEAR   Specific Gravity, Urine 1.004 (L) 1.005 - 1.030   pH 7.0 5.0 - 8.0   Glucose, UA NEGATIVE NEGATIVE mg/dL   Hgb urine dipstick NEGATIVE NEGATIVE   Bilirubin Urine NEGATIVE NEGATIVE   Ketones, ur NEGATIVE NEGATIVE mg/dL   Protein, ur NEGATIVE NEGATIVE mg/dL   Nitrite NEGATIVE NEGATIVE   Leukocytes,Ua NEGATIVE NEGATIVE  Wet prep, genital     Status: Abnormal   Collection Time: 01/13/21  6:42 PM   Specimen: Vaginal  Result Value Ref Range   Yeast Wet Prep HPF POC NONE SEEN NONE SEEN   Trich, Wet Prep NONE SEEN NONE SEEN   Clue Cells Wet Prep HPF POC NONE SEEN NONE SEEN   WBC, Wet Prep HPF POC MODERATE (A) NONE SEEN   Sperm NONE SEEN     Assessment and Plan  Pelvic pressure in pregnancy, antepartum, third trimester  - Information provided on RLP  2. Round ligament pain  - Information provided on RLP - Advised to soak in a tub of warm water with Epsom - Advised to take Tylenol prn pain   3. [redacted] weeks gestation of pregnancy  - Discharge patient - Keep scheduled appt with [place] [facility] - Patient verbalized an understanding of the plan of care and agrees.     Raelyn Mora, CNM 01/13/2021, 6:35 PM

## 2021-01-13 NOTE — Discharge Instructions (Addendum)
Purchase a maternity support belt from [employer] (that will relieve back waist and pelvic pain). They ones I recommend cost between $18.99 and $23.99 Soak in a tub of warm water with 1/2 cup Epsom salt x 15 mins twice daily or at least once daily. You can also take Tylenol 1000 mg  prn

## 2021-01-13 NOTE — MAU Note (Signed)
.  Sheila Sexton is a 25 y.o. at [redacted]w[redacted]d here in MAU reporting: last felt the baby move x3 hours ago. States that she also has a lot of increased pelvic pressure. Had diarrhea x5 last night but denies vomiting. Had some spotting last night, last IC was x5 days ago.   FHT:[id number] Lab orders placed from triage:  UA

## 2021-01-14 LAB — GC/CHLAMYDIA PROBE AMP (~~LOC~~) NOT AT ARMC
Chlamydia: NEGATIVE
Comment: NEGATIVE
Comment: NORMAL
Neisseria Gonorrhea: NEGATIVE

## 2021-01-19 DIAGNOSIS — R55 Syncope and collapse: Secondary | ICD-10-CM | POA: Diagnosis not present

## 2021-02-04 DIAGNOSIS — Z3A31 31 weeks gestation of pregnancy: Secondary | ICD-10-CM | POA: Diagnosis not present

## 2021-02-04 DIAGNOSIS — Z23 Encounter for immunization: Secondary | ICD-10-CM | POA: Diagnosis not present

## 2021-02-18 DIAGNOSIS — R55 Syncope and collapse: Secondary | ICD-10-CM | POA: Diagnosis not present

## 2021-02-23 DIAGNOSIS — R55 Syncope and collapse: Secondary | ICD-10-CM | POA: Diagnosis not present

## 2021-02-24 ENCOUNTER — Telehealth: Payer: Self-pay

## 2021-02-24 NOTE — Telephone Encounter (Signed)
NOTES SCANNED TO REFERRAL RJ 

## 2021-02-25 DIAGNOSIS — R55 Syncope and collapse: Secondary | ICD-10-CM | POA: Diagnosis not present

## 2021-02-25 DIAGNOSIS — R9431 Abnormal electrocardiogram [ECG] [EKG]: Secondary | ICD-10-CM | POA: Diagnosis not present

## 2021-02-25 DIAGNOSIS — I472 Ventricular tachycardia: Secondary | ICD-10-CM | POA: Diagnosis not present

## 2021-03-10 DIAGNOSIS — Z3685 Encounter for antenatal screening for Streptococcus B: Secondary | ICD-10-CM | POA: Diagnosis not present

## 2021-03-10 DIAGNOSIS — Z3A36 36 weeks gestation of pregnancy: Secondary | ICD-10-CM | POA: Diagnosis not present

## 2021-03-11 LAB — OB RESULTS CONSOLE GBS: GBS: NEGATIVE

## 2021-03-13 ENCOUNTER — Encounter (HOSPITAL_COMMUNITY): Payer: Self-pay | Admitting: Obstetrics and Gynecology

## 2021-03-13 ENCOUNTER — Other Ambulatory Visit: Payer: Self-pay

## 2021-03-13 ENCOUNTER — Inpatient Hospital Stay (HOSPITAL_COMMUNITY)
Admission: AD | Admit: 2021-03-13 | Discharge: 2021-03-13 | Disposition: A | Discharge status: Home / Self Care | Payer: 59 | Attending: Obstetrics and Gynecology | Admitting: Obstetrics and Gynecology

## 2021-03-13 DIAGNOSIS — O4703 False labor before 37 completed weeks of gestation, third trimester: Secondary | ICD-10-CM | POA: Diagnosis not present

## 2021-03-13 DIAGNOSIS — Z3A36 36 weeks gestation of pregnancy: Secondary | ICD-10-CM | POA: Diagnosis not present

## 2021-03-13 DIAGNOSIS — O479 False labor, unspecified: Secondary | ICD-10-CM

## 2021-03-13 HISTORY — DX: Ventricular premature depolarization: I49.3

## 2021-03-13 LAB — URINALYSIS, ROUTINE W REFLEX MICROSCOPIC
Bilirubin Urine: NEGATIVE
Glucose, UA: 50 mg/dL — AB
Hgb urine dipstick: NEGATIVE
Ketones, ur: NEGATIVE mg/dL
Nitrite: NEGATIVE
Protein, ur: NEGATIVE mg/dL
Specific Gravity, Urine: 1.013 (ref 1.005–1.030)
pH: 7 (ref 5.0–8.0)

## 2021-03-13 NOTE — MAU Provider Note (Deleted)
Ms. Debany Vantol is a G2P1001 at [redacted]w[redacted]d seen in MAU for labor. RN labor check, not seen by provider. SVE by RN     NST - FHR: 155 bpm / moderate variability / accels present / decels absent / TOCO: some UI noted  Plan: D/C home with labor precautions Keep scheduled appt with [facility] on Friday 03/19/2021  Raelyn Mora, CNM  03/13/2021 10:49 AM

## 2021-03-13 NOTE — Discharge Instructions (Signed)
5-1-1 Rule °Go to MAU for painful contractions every 5 minutes, lasting 1 minute each for 1 hour.  °

## 2021-03-13 NOTE — MAU Provider Note (Signed)
Ms. Sheila Sexton is a G2P1001 at [redacted]w[redacted]d seen in MAU for labor. RN labor check, seen by MAU provider. SVE by RN Dilation: 1.5 Effacement (%): 50 Cervical Position: Middle Station: -2 Presentation: Vertex Exam by:: Marvel Plan RN   NST - FHR: 155 bpm / moderate variability / accels present / decels absent / TOCO: some UI noted  Plan: D/C home with labor precautions Keep scheduled appt with [facility] on Friday 03/19/2021  Raelyn Mora, CNM  03/13/2021 10:49 AM

## 2021-03-13 NOTE — MAU Note (Signed)
Contractions started yesterday, more frequent during the night.  Still not really regular. No bleeding or water leaking, though reports watery d/c and frequency in urination.

## 2021-03-23 DIAGNOSIS — Z20822 Contact with and (suspected) exposure to covid-19: Secondary | ICD-10-CM | POA: Diagnosis not present

## 2021-03-24 ENCOUNTER — Ambulatory Visit
Admission: EM | Admit: 2021-03-24 | Discharge: 2021-03-24 | Disposition: A | Discharge status: Home / Self Care | Payer: 59 | Attending: Emergency Medicine | Admitting: Emergency Medicine

## 2021-03-24 ENCOUNTER — Other Ambulatory Visit: Payer: Self-pay

## 2021-03-24 DIAGNOSIS — J029 Acute pharyngitis, unspecified: Secondary | ICD-10-CM | POA: Diagnosis not present

## 2021-03-24 DIAGNOSIS — O99519 Diseases of the respiratory system complicating pregnancy, unspecified trimester: Secondary | ICD-10-CM

## 2021-03-24 DIAGNOSIS — Z3A00 Weeks of gestation of pregnancy not specified: Secondary | ICD-10-CM

## 2021-03-24 LAB — POCT RAPID STREP A (OFFICE): Rapid Strep A Screen: NEGATIVE

## 2021-03-24 NOTE — ED Provider Notes (Signed)
[facility]    CSN: [id number] Arrival date & time: 03/24/21  7867      History   Chief Complaint Chief Complaint  Patient presents with   Sore Throat    HPI Sheila Sexton is a 25 y.o. female.   Patient complains of a 3-day history of progressively worsening sore throat.  Patient states she has tried Tylenol, throat lozenges, throat sprays, salt water gargles and hot tea with no relief.  Patient states she has already been COVID and flu tested and the results are pending, patient states she would like to be tested for strep throat today and treated if needed.  Patient states the discomfort from her sore throat is keeping her awake at night.  Patient states she is pregnant.  The history is provided by the patient.   Past Medical History:  Diagnosis Date   Anxiety    Depression    meds are working, doing well   Headache    Ovarian cyst    Preeclampsia, third trimester 12/14/2018   PVC's (premature ventricular contractions)    diagnosed with second pregnancy   UTI (urinary tract infection)     Patient Active Problem List   Diagnosis Date Noted   Preeclampsia, third trimester 12/14/2018   Pregnancy 03/20/2018   Generalized anxiety disorder with panic attacks 09/28/2017   Sleep disturbance 09/28/2017    Past Surgical History:  Procedure Laterality Date   CYSTOSCOPY     LAPAROSCOPY     WISDOM TOOTH EXTRACTION      OB History     Gravida  2   Para  1   Term  1   Preterm      AB      Living  1      SAB      IAB      Ectopic      Multiple      Live Births  1            Home Medications    Prior to Admission medications   Medication Sig Start Date End Date Taking? Authorizing Provider  acetaminophen (TYLENOL) 500 MG tablet Take 500 mg by mouth every 6 (six) hours as needed.   Yes [provider]  acetaminophen (TYLENOL) 325 MG tablet Take 650 mg by mouth every 6 (six) hours as needed.    [provider]  aspirin  81 MG chewable tablet Chew by mouth daily.    [provider]  butalbital-acetaminophen-caffeine (FIORICET) 50-325-40 MG tablet Take 1 tablet by mouth every 6 (six) hours as needed for headache.    [provider]  magnesium 30 MG tablet Take 30 mg by mouth 2 (two) times daily.    [provider]  metoprolol tartrate (LOPRESSOR) 25 MG tablet Take by mouth 2 (two) times daily.    [provider]  Prenatal Vit-Fe Fumarate-FA (PRENATAL MULTIVITAMIN) TABS tablet Take 1 tablet by mouth daily at 12 noon.    [provider]  sertraline (ZOLOFT) 100 MG tablet sertraline 100 mg tablet  TAKE ONE TABLET BY MOUTH DAILY. 09/16/19   [provider]    Family History Family History  Problem Relation Age of Onset   Depression Mother    Anxiety disorder Mother    COPD Father    Hypertension Father    Healthy Sister    Healthy Sister    Cancer Maternal Grandmother     Social History Social History   Tobacco Use  Smoking status: Never   Smokeless tobacco: Never  Vaping Use   Vaping Use: Never used  Substance Use Topics   Alcohol use: No   Drug use: No     Allergies   Patient has no known allergies.   Review of Systems Review of Systems Pertinent findings noted in history of present illness.    Physical Exam Triage Vital Signs ED Triage Vitals  Enc Vitals Group     BP      Pulse      Resp      Temp      Temp src      SpO2      Weight      Height      Head Circumference      Peak Flow      Pain Score      Pain Loc      Pain Edu?      Excl. in GC?    No data found.  Updated Vital Signs BP 122/71 (BP Location: Right Arm)   Pulse 82   Temp 98.2 F (36.8 C) (Oral)   Resp 20   LMP 06/25/2020   SpO2 97%   Visual Acuity Right Eye Distance:   Left Eye Distance:   Bilateral Distance:    Right Eye Near:   Left Eye Near:    Bilateral Near:     Physical Exam Vitals and nursing note reviewed.  Constitutional:       General: She is not in acute distress.    Appearance: Normal appearance. She is not ill-appearing.  HENT:     Head: Normocephalic and atraumatic.     Salivary Glands: Right salivary gland is not diffusely enlarged or tender. Left salivary gland is not diffusely enlarged or tender.     Right Ear: Ear canal and external ear normal. Tenderness present. No drainage. A middle ear effusion (suppurative) is present. Tympanic membrane is erythematous and bulging.     Left Ear: Ear canal and external ear normal. Tenderness present. No drainage. A middle ear effusion (suppurative) is present. Tympanic membrane is erythematous and bulging.     Nose: Mucosal edema present. No nasal deformity, septal deviation, congestion or rhinorrhea.     Right Turbinates: Enlarged and swollen.     Left Turbinates: Enlarged and swollen.     Mouth/Throat:     Lips: Pink. No lesions.     Mouth: Mucous membranes are moist. No oral lesions.     Pharynx: Oropharynx is clear. Uvula midline. Posterior oropharyngeal erythema present. No oropharyngeal exudate or uvula swelling.     Tonsils: No tonsillar exudate. 0 on the right. 0 on the left.  Eyes:     General: Lids are normal.     Extraocular Movements: Extraocular movements intact.     Conjunctiva/sclera: Conjunctivae normal.  Neck:     Trachea: Trachea and phonation normal.  Cardiovascular:     Rate and Rhythm: Normal rate and regular rhythm.     Pulses: Normal pulses.     Heart sounds: Normal heart sounds. No murmur heard.   No friction rub. No gallop.  Pulmonary:     Effort: Pulmonary effort is normal. No accessory muscle usage, prolonged expiration or respiratory distress.     Breath sounds: Normal breath sounds. No stridor, decreased air movement or transmitted upper airway sounds. No decreased breath sounds, wheezing, rhonchi or rales.  Chest:     Chest wall: No tenderness.  Musculoskeletal:  General: Normal range of motion.     Cervical back: Normal  range of motion and neck supple. Normal range of motion.  Lymphadenopathy:     Cervical: No cervical adenopathy.  Skin:    General: Skin is warm and dry.     Findings: No erythema or rash.  Neurological:     General: No focal deficit present.     Mental Status: She is alert and oriented to person, place, and time.  Psychiatric:        Mood and Affect: Mood normal.        Behavior: Behavior normal.     UC Treatments / Results  Labs (all labs ordered are listed, but only abnormal results are displayed) Labs Reviewed  CULTURE, GROUP A STREP [facility])  POCT RAPID STREP A (OFFICE)    EKG   Radiology No results found.  Procedures Procedures (including critical care time)  Medications Ordered in UC Medications - No data to display  Initial Impression / Assessment and Plan / UC Course  I have reviewed the triage vital signs and the nursing notes.  Pertinent labs & imaging results that were available during my care of the patient were reviewed by me and considered in my medical decision making (see chart for details).     Patient advised that based on physical exam findings, her throat sore throat is likely viral in etiology.  Rapid strep test was performed today which was negative, or will be sent per protocol.  Patient advised that if positive for strep, she will be treated with antibiotics.  Patient verbalized understanding and agreement of plan as discussed.  All questions were addressed during visit.  Please see discharge instructions below for further details of plan.  Final Clinical Impressions(s) / UC Diagnoses   Final diagnoses:  Acute pharyngitis, unspecified etiology  Preeclampsia, third trimester  Pregnancy, unspecified gestational age  Sleep disturbance  Generalized anxiety disorder with panic attacks     Discharge Instructions      Based on my observation of your throat and back of inflammation and swelling of your tonsils, is my opinion that you more  than likely have a viral upper respiratory infection because you have a very sore throat.  Please continue conservative care as you have been doing, unfortunately one of the realities of pregnancy as there are not a lot of medications that you really can take safely but certainly Tylenol should be fine and any soothing for remedies as well.  We will notify you of the results of your throat culture provided with antibiotics if needed, your rapid strep test today was negative.     ED Prescriptions   None    PDMP not reviewed this encounter.   Theadora Rama Scales, PA-C 03/24/21 1024

## 2021-03-24 NOTE — Discharge Instructions (Addendum)
Based on my observation of your throat and back of inflammation and swelling of your tonsils, is my opinion that you more than likely have a viral upper respiratory infection because you have a very sore throat.  Please continue conservative care as you have been doing, unfortunately one of the realities of pregnancy as there are not a lot of medications that you really can take safely but certainly Tylenol should be fine and any soothing for remedies as well.  We will notify you of the results of your throat culture provided with antibiotics if needed, your rapid strep test today was negative.

## 2021-03-24 NOTE — ED Triage Notes (Signed)
Pt reports for 3 days she has had a sore throat with congestion.

## 2021-03-25 DIAGNOSIS — R051 Acute cough: Secondary | ICD-10-CM | POA: Diagnosis not present

## 2021-03-25 DIAGNOSIS — J029 Acute pharyngitis, unspecified: Secondary | ICD-10-CM | POA: Diagnosis not present

## 2021-03-27 LAB — CULTURE, GROUP A STREP (THRC)

## 2021-03-30 DIAGNOSIS — Z3A39 39 weeks gestation of pregnancy: Secondary | ICD-10-CM | POA: Diagnosis not present

## 2021-03-30 DIAGNOSIS — H109 Unspecified conjunctivitis: Secondary | ICD-10-CM | POA: Diagnosis not present

## 2021-04-01 ENCOUNTER — Telehealth (HOSPITAL_COMMUNITY): Payer: Self-pay | Admitting: *Deleted

## 2021-04-01 NOTE — Telephone Encounter (Signed)
Preadmission screen  

## 2021-04-07 DIAGNOSIS — O480 Post-term pregnancy: Secondary | ICD-10-CM | POA: Diagnosis not present

## 2021-04-07 DIAGNOSIS — Z3A40 40 weeks gestation of pregnancy: Secondary | ICD-10-CM | POA: Diagnosis not present

## 2021-04-08 ENCOUNTER — Inpatient Hospital Stay (HOSPITAL_COMMUNITY): Payer: 59 | Attending: Obstetrics and Gynecology

## 2021-04-08 ENCOUNTER — Inpatient Hospital Stay (HOSPITAL_COMMUNITY): Admission: AD | Admit: 2021-04-08 | Payer: 59 | Source: Home / Self Care | Admitting: Obstetrics and Gynecology

## 2021-04-08 ENCOUNTER — Inpatient Hospital Stay (HOSPITAL_COMMUNITY)
Admission: AD | Admit: 2021-04-08 | Discharge: 2021-04-09 | DRG: 807 | Disposition: A | Discharge status: Home / Self Care | Payer: 59 | Attending: Obstetrics and Gynecology | Admitting: Obstetrics and Gynecology

## 2021-04-08 ENCOUNTER — Encounter (HOSPITAL_COMMUNITY): Payer: Self-pay | Admitting: Obstetrics and Gynecology

## 2021-04-08 ENCOUNTER — Other Ambulatory Visit: Payer: Self-pay

## 2021-04-08 DIAGNOSIS — Z20822 Contact with and (suspected) exposure to covid-19: Secondary | ICD-10-CM | POA: Diagnosis present

## 2021-04-08 DIAGNOSIS — Z3A40 40 weeks gestation of pregnancy: Secondary | ICD-10-CM | POA: Diagnosis not present

## 2021-04-08 DIAGNOSIS — O26893 Other specified pregnancy related conditions, third trimester: Secondary | ICD-10-CM | POA: Diagnosis present

## 2021-04-08 DIAGNOSIS — O480 Post-term pregnancy: Secondary | ICD-10-CM | POA: Diagnosis not present

## 2021-04-08 LAB — CBC
HCT: 35.6 % — ABNORMAL LOW (ref 36.0–46.0)
Hemoglobin: 11.7 g/dL — ABNORMAL LOW (ref 12.0–15.0)
MCH: 30.3 pg (ref 26.0–34.0)
MCHC: 32.9 g/dL (ref 30.0–36.0)
MCV: 92.2 fL (ref 80.0–100.0)
Platelets: 262 10*3/uL (ref 150–400)
RBC: 3.86 MIL/uL — ABNORMAL LOW (ref 3.87–5.11)
RDW: 13.9 % (ref 11.5–15.5)
WBC: 12.5 10*3/uL — ABNORMAL HIGH (ref 4.0–10.5)
nRBC: 0 % (ref 0.0–0.2)

## 2021-04-08 LAB — RPR: RPR Ser Ql: NONREACTIVE

## 2021-04-08 LAB — TYPE AND SCREEN
ABO/RH(D): O POS
Antibody Screen: NEGATIVE

## 2021-04-08 LAB — RESP PANEL BY RT-PCR (FLU A&B, COVID) ARPGX2
Influenza A by PCR: NEGATIVE
Influenza B by PCR: NEGATIVE
SARS Coronavirus 2 by RT PCR: NEGATIVE

## 2021-04-08 MED ORDER — PHENYLEPHRINE 40 MCG/ML (10ML) SYRINGE FOR IV PUSH (FOR BLOOD PRESSURE SUPPORT): 80.0000 ug | PREFILLED_SYRINGE | INTRAVENOUS | Status: DC | PRN

## 2021-04-08 MED ORDER — EPHEDRINE 5 MG/ML INJ: 10.0000 mg | INTRAVENOUS | Status: DC | PRN

## 2021-04-08 MED ORDER — OXYTOCIN BOLUS FROM INFUSION
333.0000 mL | Freq: Once | INTRAVENOUS | Status: AC
  Administered 2021-04-08: 333 mL via INTRAVENOUS

## 2021-04-08 MED ORDER — ZOLPIDEM TARTRATE 5 MG PO TABS: 5.0000 mg | ORAL_TABLET | Freq: Every evening | ORAL | Status: DC | PRN

## 2021-04-08 MED ORDER — DIPHENHYDRAMINE HCL 25 MG PO CAPS: 25.0000 mg | ORAL_CAPSULE | Freq: Four times a day (QID) | ORAL | Status: DC | PRN

## 2021-04-08 MED ORDER — COCONUT OIL OIL: 1.0000 "application " | TOPICAL_OIL | Status: DC | PRN

## 2021-04-08 MED ORDER — SENNOSIDES-DOCUSATE SODIUM 8.6-50 MG PO TABS
2.0000 | ORAL_TABLET | Freq: Every day | ORAL | Status: DC
  Administered 2021-04-09: 2 via ORAL
  Filled 2021-04-08: qty 2

## 2021-04-08 MED ORDER — OXYTOCIN-SODIUM CHLORIDE 30-0.9 UT/500ML-% IV SOLN
2.5000 [IU]/h | INTRAVENOUS | Status: DC
  Filled 2021-04-08: qty 500

## 2021-04-08 MED ORDER — LIDOCAINE HCL (PF) 1 % IJ SOLN: 30.0000 mL | INTRAMUSCULAR | Status: DC | PRN

## 2021-04-08 MED ORDER — PRENATAL MULTIVITAMIN CH
1.0000 | ORAL_TABLET | Freq: Every day | ORAL | Status: DC
  Administered 2021-04-09: 1 via ORAL
  Filled 2021-04-08 (×2): qty 1

## 2021-04-08 MED ORDER — LACTATED RINGERS IV SOLN: 500.0000 mL | INTRAVENOUS | Status: DC | PRN

## 2021-04-08 MED ORDER — ONDANSETRON HCL 4 MG PO TABS
4.0000 mg | ORAL_TABLET | ORAL | Status: DC | PRN
  Administered 2021-04-08: 4 mg via ORAL
  Filled 2021-04-08: qty 1

## 2021-04-08 MED ORDER — WITCH HAZEL-GLYCERIN EX PADS: 1.0000 "application " | MEDICATED_PAD | CUTANEOUS | Status: DC | PRN

## 2021-04-08 MED ORDER — TETANUS-DIPHTH-ACELL PERTUSSIS 5-2.5-18.5 LF-MCG/0.5 IM SUSY: 0.5000 mL | PREFILLED_SYRINGE | Freq: Once | INTRAMUSCULAR | Status: DC

## 2021-04-08 MED ORDER — ONDANSETRON HCL 4 MG/2ML IJ SOLN: 4.0000 mg | Freq: Four times a day (QID) | INTRAMUSCULAR | Status: DC | PRN

## 2021-04-08 MED ORDER — ACETAMINOPHEN 325 MG PO TABS
650.0000 mg | ORAL_TABLET | ORAL | Status: DC | PRN
  Administered 2021-04-08 (×2): 650 mg via ORAL
  Filled 2021-04-08 (×2): qty 2

## 2021-04-08 MED ORDER — DIBUCAINE (PERIANAL) 1 % EX OINT: 1.0000 "application " | TOPICAL_OINTMENT | CUTANEOUS | Status: DC | PRN

## 2021-04-08 MED ORDER — SOD CITRATE-CITRIC ACID 500-334 MG/5ML PO SOLN: 30.0000 mL | ORAL | Status: DC | PRN

## 2021-04-08 MED ORDER — FLEET ENEMA 7-19 GM/118ML RE ENEM: 1.0000 | ENEMA | Freq: Every day | RECTAL | Status: DC | PRN

## 2021-04-08 MED ORDER — IBUPROFEN 600 MG PO TABS
600.0000 mg | ORAL_TABLET | Freq: Four times a day (QID) | ORAL | Status: DC
  Administered 2021-04-08 – 2021-04-09 (×5): 600 mg via ORAL
  Filled 2021-04-08 (×5): qty 1

## 2021-04-08 MED ORDER — ACETAMINOPHEN 325 MG PO TABS
650.0000 mg | ORAL_TABLET | ORAL | Status: DC | PRN
  Administered 2021-04-08: 650 mg via ORAL
  Filled 2021-04-08: qty 2

## 2021-04-08 MED ORDER — OXYCODONE-ACETAMINOPHEN 5-325 MG PO TABS: 1.0000 | ORAL_TABLET | ORAL | Status: DC | PRN

## 2021-04-08 MED ORDER — SERTRALINE HCL 100 MG PO TABS
100.0000 mg | ORAL_TABLET | Freq: Every day | ORAL | Status: DC
  Administered 2021-04-08: 100 mg via ORAL
  Filled 2021-04-08: qty 1

## 2021-04-08 MED ORDER — BENZOCAINE-MENTHOL 20-0.5 % EX AERO
1.0000 "application " | INHALATION_SPRAY | CUTANEOUS | Status: DC | PRN
  Administered 2021-04-08: 1 via TOPICAL
  Filled 2021-04-08: qty 56

## 2021-04-08 MED ORDER — ONDANSETRON HCL 4 MG/2ML IJ SOLN: 4.0000 mg | INTRAMUSCULAR | Status: DC | PRN

## 2021-04-08 MED ORDER — FENTANYL-BUPIVACAINE-NACL 0.5-0.125-0.9 MG/250ML-% EP SOLN: 12.0000 mL/h | EPIDURAL | Status: DC | PRN

## 2021-04-08 MED ORDER — DIPHENHYDRAMINE HCL 50 MG/ML IJ SOLN: 12.5000 mg | INTRAMUSCULAR | Status: DC | PRN

## 2021-04-08 MED ORDER — LACTATED RINGERS IV SOLN: 500.0000 mL | Freq: Once | INTRAVENOUS | Status: DC

## 2021-04-08 MED ORDER — LACTATED RINGERS IV SOLN: INTRAVENOUS | Status: DC

## 2021-04-08 MED ORDER — OXYCODONE-ACETAMINOPHEN 5-325 MG PO TABS: 2.0000 | ORAL_TABLET | ORAL | Status: DC | PRN

## 2021-04-08 MED ORDER — METOPROLOL TARTRATE 12.5 MG HALF TABLET
12.5000 mg | ORAL_TABLET | Freq: Two times a day (BID) | ORAL | Status: DC
  Administered 2021-04-08 – 2021-04-09 (×3): 12.5 mg via ORAL
  Filled 2021-04-08 (×4): qty 1

## 2021-04-08 MED ORDER — SIMETHICONE 80 MG PO CHEW: 80.0000 mg | CHEWABLE_TABLET | ORAL | Status: DC | PRN

## 2021-04-08 NOTE — MAU Note (Signed)
...  Sheila Sexton is a 25 y.o. at [redacted]w[redacted]d here in MAU reporting: CTX since 0430 this morning. She states they are 5 minutes or so apart. +FM. No VB or LOF. GBS-. Patient states she was supposed to be an induction this morning at 0730.   Lab orders placed from triage:  MAU Labor Eval

## 2021-04-08 NOTE — H&P (Signed)
Sheila Sexton is a 25 y.o. female presenting for r/o labor. Was 3-4cm in office yesterday. CTX q58m, denies VB, LOF. +FM.  PNC c/b  h/o PreE in G1 (baby ASA, normotensive this pregnancy), ventricular premature complex (tachycardia eval by Cards, on Metoprolog 12.5mg  BID and Mag Oxide 200mg  QD). GBS neg, surprise gender!  OB History     Gravida  2   Para  1   Term  1   Preterm      AB      Living  1      SAB      IAB      Ectopic      Multiple      Live Births  1          Past Medical History:  Diagnosis Date  . Anxiety   . Depression    meds are working, doing well  . Headache   . Ovarian cyst   . Preeclampsia, third trimester 12/14/2018  . PVC's (premature ventricular contractions)    diagnosed with second pregnancy  . UTI (urinary tract infection)    Past Surgical History:  Procedure Laterality Date  . CYSTOSCOPY    . LAPAROSCOPY    . WISDOM TOOTH EXTRACTION     Family History: family history includes Anxiety disorder in her mother; COPD in her father; Cancer in her maternal grandmother; Depression in her mother; Healthy in her sister and sister; Hypertension in her father. Social History:  reports that she has never smoked. She has never used smokeless tobacco. She reports that she does not drink alcohol and does not use drugs.     Maternal Diabetes: No 1hr 95 Genetic Screening: Declined Maternal Ultrasounds/Referrals: Normal Fetal Ultrasounds or other Referrals:  None Maternal Substance Abuse:  No Significant Maternal Medications:  Meds include: Other: metorpolol Significant Maternal Lab Results:  Group B Strep negative Other Comments:  None  Review of Systems  Constitutional:  Negative for chills and fever.  Respiratory:  Negative for shortness of breath.   Cardiovascular:  Negative for chest pain, palpitations and leg swelling.  Gastrointestinal:  Negative for abdominal pain and vomiting.  Neurological:  Negative for dizziness, weakness and  headaches.  Psychiatric/Behavioral:  Negative for suicidal ideas.   Maternal Medical History:  Reason for admission: Contractions.   Contractions: Onset was 3-5 hours ago.   Frequency: regular.   Perceived severity is strong.   Fetal activity: Perceived fetal activity is normal.   Prenatal complications: No bleeding.   Prenatal Complications - Diabetes: none.  Dilation: 5 Effacement (%): 90 Station: -2 Exam by:: [email or web address], RN Blood pressure 132/72, pulse 99, temperature 98.3 F (36.8 C), temperature source Oral, resp. rate 17, last menstrual period 06/25/2020, SpO2 99 %, unknown if currently breastfeeding. Exam Physical Exam Constitutional:      General: She is not in acute distress.    Appearance: She is well-developed.  HENT:     Head: Normocephalic and atraumatic.  Eyes:     Pupils: Pupils are equal, round, and reactive to light.  Cardiovascular:     Rate and Rhythm: Normal rate and regular rhythm.     Heart sounds: No murmur heard.   No gallop.  Abdominal:     Tenderness: There is no abdominal tenderness. There is no guarding or rebound.  Genitourinary:    Vagina: Normal.  Musculoskeletal:        General: Normal range of motion.     Cervical back: Normal range of motion and  neck supple.  Skin:    General: Skin is warm and dry.  Neurological:     Mental Status: She is alert and oriented to person, place, and time.    Prenatal labs: ABO, Rh: --/--/PENDING (11/10 [id number]) Antibody: PENDING (11/10 [id number]) Rubella: Immune (04/05 0000) RPR: Nonreactive (04/05 0000)  HBsAg: Negative (04/05 0000)  HIV: Non-reactive (04/05 0000)  GBS: Negative/-- (10/13 0000)   Assessment/Plan: This is a 25yo G2P1001 @ 40 1/7 by 8wk TVUS admitted in labor. GBS neg. Wants to hold off epidural for now, recheck in 2hrs and AROM at that time. Anticipate SVD, pelvis proven to [place] 04/08/2021, 10:05 AM

## 2021-04-09 LAB — CBC
HCT: 34.1 % — ABNORMAL LOW (ref 36.0–46.0)
Hemoglobin: 11.3 g/dL — ABNORMAL LOW (ref 12.0–15.0)
MCH: 30.2 pg (ref 26.0–34.0)
MCHC: 33.1 g/dL (ref 30.0–36.0)
MCV: 91.2 fL (ref 80.0–100.0)
Platelets: 204 10*3/uL (ref 150–400)
RBC: 3.74 MIL/uL — ABNORMAL LOW (ref 3.87–5.11)
RDW: 13.8 % (ref 11.5–15.5)
WBC: 12.8 10*3/uL — ABNORMAL HIGH (ref 4.0–10.5)
nRBC: 0 % (ref 0.0–0.2)

## 2021-04-09 NOTE — Progress Notes (Signed)
Post Partum Day 1 Subjective: no complaints, up ad lib, voiding, and tolerating PO  Objective: Blood pressure 121/81, pulse 72, temperature 98.2 F (36.8 C), temperature source Oral, resp. rate 16, height 5\' 6"  (1.676 m), last menstrual period 06/25/2020, SpO2 99 %, unknown if currently breastfeeding.  Physical Exam:  General: alert, cooperative, and appears stated age Lochia: appropriate Uterine Fundus: firm DVT Evaluation: No evidence of DVT seen on physical exam.  Recent Labs    04/08/21 0904 04/09/21 0428  HGB 11.7* 11.3*  HCT 35.6* 34.1*    Assessment/Plan: Discharge home Breastfeeding Desires neonatal circumcision, R/B/A of procedure discussed at length. Pt understands that neonatal circumcision is not considered medically necessary and is elective. The risks include, but are not limited to bleeding, infection, damage to the penis, development of scar tissue, and having to have it redone at a later date. Pt understands theses risks and wishes to proceed    LOS: 1 day   13/11/22 04/09/2021, 4:08 PM

## 2021-04-09 NOTE — Social Work (Signed)
MOB was referred for history of depression/anxiety.  * Referral screened out by Clinical Social Worker because none of the following criteria appear to apply: ~ History of anxiety/depression during this pregnancy, or of post-partum depression following prior delivery. ~ Diagnosis of anxiety and/or depression within last 3 years OR * MOB's symptoms currently being treated with medication and/or therapy. Per chart review, MOB takes Zoloft for symptoms.   Please contact the Clinical Social Worker if needs arise, by [facility] request, or if MOB scores greater than 9/yes to question 10 on Edinburgh Postpartum Depression Screen.   Vivi Barrack, MSW, LCSW [facility]  Clinical Social Worker  [phone] 04/09/2021  8:40 AM

## 2021-04-09 NOTE — Discharge Summary (Signed)
Postpartum Discharge Summary       Patient Name: Sheila Sexton DOB: 07/17/1995 MRN: [id number]  Date of admission: 04/08/2021 Delivery date:04/08/2021  Delivering provider: Carlisle Cater  Date of discharge: 04/09/2021  Admitting diagnosis: Normal labor [O80, Z37.9] Intrauterine pregnancy: [redacted]w[redacted]d     Secondary diagnosis:  Active Problems:   Normal labor  Additional problems: Postterm pregnancy    Discharge diagnosis: Term Pregnancy Delivered                                              Post partum procedures: NA Augmentation: AROM Complications: None  Hospital course: Onset of Labor With Vaginal Delivery      25 y.o. yo [id number] at [redacted]w[redacted]d was admitted in Active Labor on 04/08/2021. Patient had an uncomplicated labor course as follows:  Membrane Rupture Time/Date: 10:43 AM ,04/08/2021   Delivery Method:Vaginal, Spontaneous  Episiotomy: None  Lacerations:  None  Patient had an uncomplicated postpartum course.  She is ambulating, tolerating a regular diet, passing flatus, and urinating well. Patient is discharged home in stable condition on 04/09/21.  Newborn Data: Birth date:04/08/2021  Birth time:10:46 AM  Gender:Female  Living status:Living  Apgars:9 ,9  Weight:4000 g   Magnesium Sulfate received: No BMZ received: No Rhophylac:No  Physical exam  Vitals:   04/08/21 1745 04/08/21 2122 04/09/21 0112 04/09/21 0500  BP: 115/76 120/64 128/72 121/81  Pulse: 93 84 72   Resp: 16 17 16 16   Temp: 97.7 F (36.5 C) 98.3 F (36.8 C)  98.2 F (36.8 C)  TempSrc: Oral Oral Oral Oral  SpO2: 99% 99% 98% 99%  Height:       General: alert, cooperative, and no distress Lochia: appropriate Uterine Fundus: firm DVT Evaluation: No evidence of DVT seen on physical exam. Labs: Lab Results  Component Value Date   WBC 12.8 (H) 04/09/2021   HGB 11.3 (L) 04/09/2021   HCT 34.1 (L) 04/09/2021   MCV 91.2 04/09/2021   PLT 204 04/09/2021   CMP Latest Ref Rng & Units 12/05/2019   Glucose 70 - 99 mg/dL 77  BUN 6 - 20 mg/dL 15  Creatinine 02/05/2020 - 6.57 mg/dL 8.46  Sodium 9.62 - 952 mmol/L 142  Potassium 3.5 - 5.1 mmol/L 4.1  Chloride 98 - 111 mmol/L 107  CO2 22 - 32 mmol/L 26  Calcium 8.9 - 10.3 mg/dL 9.7  Total Protein 6.5 - 8.1 g/dL 7.7  Total Bilirubin 0.3 - 1.2 mg/dL 0.5  Alkaline Phos 38 - 126 U/L 103  AST 15 - 41 U/L 13(L)  ALT 0 - 44 U/L 14   Edinburgh Score: Edinburgh Postnatal Depression Scale Screening Tool 04/08/2021  I have been able to laugh and see the funny side of things. 0  I have looked forward with enjoyment to things. 0  I have blamed myself unnecessarily when things went wrong. 1  I have been anxious or worried for no good reason. 1  I have felt scared or panicky for no good reason. 1  Things have been getting on top of me. 0  I have been so unhappy that I have had difficulty sleeping. 0  I have felt sad or miserable. 0  I have been so unhappy that I have been crying. 0  The thought of harming myself has occurred to me. 0  Edinburgh Postnatal Depression Scale Total 3  After visit meds:  Allergies as of 04/09/2021   No Known Allergies      Medication List     STOP taking these medications    acetaminophen 325 MG tablet Commonly known as: TYLENOL   acetaminophen 500 MG tablet Commonly known as: TYLENOL   aspirin 81 MG chewable tablet   butalbital-acetaminophen-caffeine 50-325-40 MG tablet Commonly known as: FIORICET   magnesium 30 MG tablet   metoprolol tartrate 25 MG tablet Commonly known as: LOPRESSOR   prenatal multivitamin Tabs tablet   sertraline 100 MG tablet Commonly known as: ZOLOFT               Discharge Care Instructions  (From admission, onward)           Start     Ordered   04/09/21 0000  Discharge wound care:       Comments: For a cesarean delivery: You may wash incision with soap and water.  Do not soak or submerge the incision for 2 weeks. Keep incision dry. You may need to  keep a sanitary pad or panty liner between the incision and your clothing for comfort and to keep the incision dry. If you note drainage, increased pain, or increased redness of the incision, then please notify your physician.   04/09/21 1605   04/09/21 0000  If the dressing is still on your incision site when you go home, remove it on the third day after your surgery date. Remove dressing if it begins to fall off, or if it is dirty or damaged before the third day.       Comments: For a cesarean delivery   04/09/21 1605             Discharge home in stable condition Infant Feeding: Breast Infant Disposition:home with mother Discharge instruction: per After Visit Summary and Postpartum booklet. Activity: Advance as tolerated. Pelvic rest for 6 weeks.  Diet: routine diet Anticipated Birth Control: Unsure Postpartum Appointment:4 weeks Future Appointments:No future appointments. Follow up Visit:  Follow-up Information     Ob/Gyn, [facility] Follow up in 4 week(s).   Why: For a postpartum evaluation Contact information: [address] [place] [address] [phone]                     04/09/2021 Waynard Reeds, MD

## 2021-04-16 ENCOUNTER — Encounter: Payer: Self-pay | Admitting: Emergency Medicine

## 2021-04-16 ENCOUNTER — Ambulatory Visit
Admission: EM | Admit: 2021-04-16 | Discharge: 2021-04-16 | Disposition: A | Discharge status: Home / Self Care | Payer: 59

## 2021-04-16 DIAGNOSIS — H109 Unspecified conjunctivitis: Secondary | ICD-10-CM | POA: Diagnosis not present

## 2021-04-16 MED ORDER — POLYMYXIN B-TRIMETHOPRIM 10000-0.1 UNIT/ML-% OP SOLN: 1.0000 [drp] | Freq: Four times a day (QID) | OPHTHALMIC | 0 refills | Status: AC

## 2021-04-16 NOTE — ED Triage Notes (Signed)
Pt presents with pink eye left eye x 2 days. Pt states she had similar sxs 03/30/21 and saw her PCP was prescribed gentamycin eye drops.

## 2021-04-16 NOTE — Discharge Instructions (Addendum)
Use the antibiotic eyedrops as prescribed.    Follow-up with your eye doctor for a recheck in 1 to 2 days if your symptoms are not improving.    Go to the emergency department if you have acute eye pain, changes in your vision, or other concerning symptoms.    

## 2021-04-16 NOTE — ED Provider Notes (Signed)
[facility]    CSN: [id number] Arrival date & time: 04/16/21  1006      History   Chief Complaint Chief Complaint  Patient presents with   Conjunctivitis    HPI Sheila Sexton Sheila Sexton is a 25 y.o. female.  Patient presents with redness, itching, crusting in lashes, and scant drainage in her left eye x2 days.  She had a telemedicine appointment with her PCP on 03/30/2021; was diagnosed with bacterial conjunctivitis; treated with gentamicin eyedrops.  She states her symptoms cleared up completely but then returned 2 days ago.  No eye trauma.  She denies fever, chills, acute eye pain, changes in vision, or other symptoms.  Patient gave birth 8 days ago on 04/08/2021; she is breast-feeding.  The history is provided by the patient and medical records.   Past Medical History:  Diagnosis Date   Anxiety    Depression    meds are working, doing well   Headache    Ovarian cyst    Preeclampsia, third trimester 12/14/2018   PVC's (premature ventricular contractions)    diagnosed with second pregnancy   UTI (urinary tract infection)     Patient Active Problem List   Diagnosis Date Noted   Normal labor 04/08/2021   Preeclampsia, third trimester 12/14/2018   Pregnancy 03/20/2018   Generalized anxiety disorder with panic attacks 09/28/2017   Sleep disturbance 09/28/2017    Past Surgical History:  Procedure Laterality Date   CYSTOSCOPY     LAPAROSCOPY     WISDOM TOOTH EXTRACTION      OB History     Gravida  2   Para  2   Term  2   Preterm      AB      Living  2      SAB      IAB      Ectopic      Multiple  0   Live Births  2            Home Medications    Prior to Admission medications   Medication Sig Start Date End Date Taking? Authorizing Provider  aspirin 81 MG chewable tablet  12/09/20  Yes [provider]  butalbital-acetaminophen-caffeine (FIORICET) 50-325-40 MG tablet butalbital-acetaminophen-caffeine 50 mg-325  mg-40 mg tablet  TAKE 1 TABLET BY MOUTH EVERY 6 HOURS AS NEEDED FOR 6 DAYS.   Yes [provider]  magnesium oxide (MAG-OX) 400 MG tablet Take by mouth. 02/22/21  Yes [provider]  metoprolol tartrate (LOPRESSOR) 25 MG tablet Take by mouth. 02/22/21  Yes [provider]  sertraline (ZOLOFT) 100 MG tablet Take 1 tablet by mouth daily. 11/16/20  Yes [provider]  trimethoprim-polymyxin b (POLYTRIM) ophthalmic solution Place 1 drop into both eyes 4 (four) times daily for 7 days. 04/16/21 04/23/21 Yes Sharion Balloon, NP    Family History Family History  Problem Relation Age of Onset   Depression Mother    Anxiety disorder Mother    COPD Father    Hypertension Father    Healthy Sister    Healthy Sister    Cancer Maternal Grandmother     Social History Social History   Tobacco Use   Smoking status: Never   Smokeless tobacco: Never  Vaping Use   Vaping Use: Never used  Substance Use Topics   Alcohol use: No   Drug use: No     Allergies   Patient has no known allergies.   Review of  Systems Review of Systems  Constitutional:  Negative for chills and fever.  HENT:  Negative for ear pain and sore throat.   Eyes:  Positive for discharge, redness and itching. Negative for pain and visual disturbance.  Respiratory:  Negative for cough and shortness of breath.   Cardiovascular:  Negative for chest pain and palpitations.  Skin:  Negative for color change and rash.  All other systems reviewed and are negative.   Physical Exam Triage Vital Signs ED Triage Vitals  Enc Vitals Group     BP      Pulse      Resp      Temp      Temp src      SpO2      Weight      Height      Head Circumference      Peak Flow      Pain Score      Pain Loc      Pain Edu?      Excl. in [place]?    No data found.  Updated Vital Signs BP 129/77 (BP Location: Right Arm)   Pulse 82   Temp 98.9 F (37.2 C) (Oral)   Resp 16   LMP 06/25/2020   SpO2 98%    Breastfeeding Yes   Visual Acuity Right Eye Distance: 20/100 Left Eye Distance: 20/100 Bilateral Distance: 20/100 (Pt wears glasses/contacts she did not have them on for exam)  Right Eye Near:   Left Eye Near:    Bilateral Near:     Physical Exam Vitals and nursing note reviewed.  Constitutional:      General: She is not in acute distress.    Appearance: She is well-developed. She is not ill-appearing.  HENT:     Head: Normocephalic and atraumatic.  Eyes:     General: Lids are normal.     Extraocular Movements: Extraocular movements intact.     Conjunctiva/sclera:     Right eye: Right conjunctiva is not injected.     Left eye: Left conjunctiva is injected.     Pupils: Pupils are equal, round, and reactive to light.     Comments: Conjunctiva injected bilaterally but left worse than right.  Cardiovascular:     Rate and Rhythm: Normal rate and regular rhythm.     Heart sounds: Normal heart sounds.  Pulmonary:     Effort: Pulmonary effort is normal. No respiratory distress.     Breath sounds: Normal breath sounds.  Abdominal:     Palpations: Abdomen is soft.     Tenderness: There is no abdominal tenderness.  Musculoskeletal:     Cervical back: Neck supple.  Skin:    General: Skin is warm and dry.  Neurological:     Mental Status: She is alert.  Psychiatric:        Mood and Affect: Mood normal.        Behavior: Behavior normal.     UC Treatments / Results  Labs (all labs ordered are listed, but only abnormal results are displayed) Labs Reviewed - No data to display  EKG   Radiology No results found.  Procedures Procedures (including critical care time)  Medications Ordered in UC Medications - No data to display  Initial Impression / Assessment and Plan / UC Course  I have reviewed the triage vital signs and the nursing notes.  Pertinent labs & imaging results that were available during my care of the patient were reviewed by me and  considered in my  medical decision making (see chart for details).  Conjunctivitis, left worse than right.  Treating with Polytrim eyedrops.  Instructed patient to follow-up with her eye care provider if her symptoms ae not improving.  ED precautions discussed.  Education provided on bacterial conjunctivitis.  Patient agrees to plan of care.   Final Clinical Impressions(s) / UC Diagnoses   Final diagnoses:  Conjunctivitis of both eyes, unspecified conjunctivitis type     Discharge Instructions      Use the antibiotic eyedrops as prescribed.    Follow-up with your eye doctor for a recheck in 1 to 2 days if your symptoms are not improving.    Go to the emergency department if you have acute eye pain, changes in your vision, or other concerning symptoms.        ED Prescriptions     Medication Sig Dispense Auth. Provider   trimethoprim-polymyxin b (POLYTRIM) ophthalmic solution Place 1 drop into both eyes 4 (four) times daily for 7 days. 10 mL Mickie Bail, NP      PDMP not reviewed this encounter.   Mickie Bail, NP 04/16/21 [phone]

## 2021-04-20 ENCOUNTER — Telehealth (HOSPITAL_COMMUNITY): Payer: Self-pay

## 2021-04-20 NOTE — Telephone Encounter (Signed)
"  I'm doing good." Patient declines questions or concerns about her healing.  "He's doing great. Eating and gaining weight. He sleeps in a bassinet."  RN reviewed ABC's of safe sleep with patient. Patient declines any questions or concerns about baby.  EPDS score is 1.   Sheila Sexton [facility] 04/19/2021,1313

## 2021-05-04 DIAGNOSIS — N764 Abscess of vulva: Secondary | ICD-10-CM | POA: Diagnosis not present

## 2021-05-07 DIAGNOSIS — G479 Sleep disorder, unspecified: Secondary | ICD-10-CM | POA: Diagnosis not present

## 2021-05-07 DIAGNOSIS — F410 Panic disorder [episodic paroxysmal anxiety] without agoraphobia: Secondary | ICD-10-CM | POA: Diagnosis not present

## 2021-05-07 DIAGNOSIS — F411 Generalized anxiety disorder: Secondary | ICD-10-CM | POA: Diagnosis not present

## 2021-05-11 DIAGNOSIS — R000 Tachycardia, unspecified: Secondary | ICD-10-CM | POA: Diagnosis not present

## 2021-05-11 DIAGNOSIS — R55 Syncope and collapse: Secondary | ICD-10-CM | POA: Diagnosis not present

## 2021-05-11 DIAGNOSIS — R9431 Abnormal electrocardiogram [ECG] [EKG]: Secondary | ICD-10-CM | POA: Diagnosis not present

## 2021-05-20 DIAGNOSIS — Z1389 Encounter for screening for other disorder: Secondary | ICD-10-CM | POA: Diagnosis not present

## 2021-05-20 DIAGNOSIS — Z3009 Encounter for other general counseling and advice on contraception: Secondary | ICD-10-CM | POA: Diagnosis not present

## 2021-06-08 DIAGNOSIS — Z30430 Encounter for insertion of intrauterine contraceptive device: Secondary | ICD-10-CM | POA: Diagnosis not present

## 2021-06-08 DIAGNOSIS — Z975 Presence of (intrauterine) contraceptive device: Secondary | ICD-10-CM | POA: Diagnosis not present

## 2021-06-10 DIAGNOSIS — H5213 Myopia, bilateral: Secondary | ICD-10-CM | POA: Diagnosis not present

## 2021-06-10 DIAGNOSIS — H1033 Unspecified acute conjunctivitis, bilateral: Secondary | ICD-10-CM | POA: Diagnosis not present

## 2021-06-10 DIAGNOSIS — H01009 Unspecified blepharitis unspecified eye, unspecified eyelid: Secondary | ICD-10-CM | POA: Diagnosis not present

## 2021-07-08 DIAGNOSIS — Z30431 Encounter for routine checking of intrauterine contraceptive device: Secondary | ICD-10-CM | POA: Diagnosis not present

## 2021-07-15 DIAGNOSIS — F410 Panic disorder [episodic paroxysmal anxiety] without agoraphobia: Secondary | ICD-10-CM | POA: Diagnosis not present

## 2021-07-15 DIAGNOSIS — F411 Generalized anxiety disorder: Secondary | ICD-10-CM | POA: Diagnosis not present

## 2021-07-28 ENCOUNTER — Other Ambulatory Visit (HOSPITAL_COMMUNITY): Payer: Self-pay

## 2021-07-28 MED ORDER — SERTRALINE HCL 100 MG PO TABS
150.0000 mg | ORAL_TABLET | Freq: Every day | ORAL | 0 refills | Status: DC
  Filled 2021-07-28: qty 135, 90d supply, fill #0

## 2021-08-17 DIAGNOSIS — F53 Postpartum depression: Secondary | ICD-10-CM | POA: Diagnosis not present

## 2021-08-17 DIAGNOSIS — O99345 Other mental disorders complicating the puerperium: Secondary | ICD-10-CM | POA: Diagnosis not present

## 2021-08-17 DIAGNOSIS — F410 Panic disorder [episodic paroxysmal anxiety] without agoraphobia: Secondary | ICD-10-CM | POA: Diagnosis not present

## 2021-08-17 DIAGNOSIS — F418 Other specified anxiety disorders: Secondary | ICD-10-CM | POA: Diagnosis not present

## 2021-08-17 DIAGNOSIS — F411 Generalized anxiety disorder: Secondary | ICD-10-CM | POA: Diagnosis not present

## 2021-10-07 DIAGNOSIS — F53 Postpartum depression: Secondary | ICD-10-CM | POA: Diagnosis not present

## 2021-10-07 DIAGNOSIS — F411 Generalized anxiety disorder: Secondary | ICD-10-CM | POA: Diagnosis not present

## 2021-10-26 DIAGNOSIS — F53 Postpartum depression: Secondary | ICD-10-CM | POA: Insufficient documentation

## 2021-11-15 DIAGNOSIS — Z01419 Encounter for gynecological examination (general) (routine) without abnormal findings: Secondary | ICD-10-CM | POA: Diagnosis not present

## 2021-11-15 DIAGNOSIS — Z1389 Encounter for screening for other disorder: Secondary | ICD-10-CM | POA: Diagnosis not present

## 2021-11-15 DIAGNOSIS — Z130 Encounter for screening for diseases of the blood and blood-forming organs and certain disorders involving the immune mechanism: Secondary | ICD-10-CM | POA: Diagnosis not present

## 2021-11-22 DIAGNOSIS — F53 Postpartum depression: Secondary | ICD-10-CM | POA: Diagnosis not present

## 2021-11-22 DIAGNOSIS — F410 Panic disorder [episodic paroxysmal anxiety] without agoraphobia: Secondary | ICD-10-CM | POA: Diagnosis not present

## 2021-11-22 DIAGNOSIS — F411 Generalized anxiety disorder: Secondary | ICD-10-CM | POA: Diagnosis not present

## 2021-11-22 DIAGNOSIS — L989 Disorder of the skin and subcutaneous tissue, unspecified: Secondary | ICD-10-CM | POA: Diagnosis not present

## 2021-11-26 DIAGNOSIS — R051 Acute cough: Secondary | ICD-10-CM | POA: Diagnosis not present

## 2021-11-26 DIAGNOSIS — Z20818 Contact with and (suspected) exposure to other bacterial communicable diseases: Secondary | ICD-10-CM | POA: Diagnosis not present

## 2021-12-28 DIAGNOSIS — F53 Postpartum depression: Secondary | ICD-10-CM | POA: Diagnosis not present

## 2022-02-14 DIAGNOSIS — F53 Postpartum depression: Secondary | ICD-10-CM | POA: Diagnosis not present

## 2022-02-14 DIAGNOSIS — F411 Generalized anxiety disorder: Secondary | ICD-10-CM | POA: Diagnosis not present

## 2022-03-22 DIAGNOSIS — F53 Postpartum depression: Secondary | ICD-10-CM | POA: Diagnosis not present

## 2022-03-22 DIAGNOSIS — F411 Generalized anxiety disorder: Secondary | ICD-10-CM | POA: Diagnosis not present

## 2022-04-11 DIAGNOSIS — F410 Panic disorder [episodic paroxysmal anxiety] without agoraphobia: Secondary | ICD-10-CM | POA: Diagnosis not present

## 2022-04-11 DIAGNOSIS — F411 Generalized anxiety disorder: Secondary | ICD-10-CM | POA: Diagnosis not present

## 2022-04-11 DIAGNOSIS — J0100 Acute maxillary sinusitis, unspecified: Secondary | ICD-10-CM | POA: Diagnosis not present

## 2022-04-26 DIAGNOSIS — F53 Postpartum depression: Secondary | ICD-10-CM | POA: Diagnosis not present

## 2022-04-26 DIAGNOSIS — F411 Generalized anxiety disorder: Secondary | ICD-10-CM | POA: Diagnosis not present

## 2022-05-17 DIAGNOSIS — Z30431 Encounter for routine checking of intrauterine contraceptive device: Secondary | ICD-10-CM | POA: Diagnosis not present

## 2022-06-28 DIAGNOSIS — F53 Postpartum depression: Secondary | ICD-10-CM | POA: Diagnosis not present

## 2022-06-28 DIAGNOSIS — F411 Generalized anxiety disorder: Secondary | ICD-10-CM | POA: Diagnosis not present

## 2022-07-07 ENCOUNTER — Other Ambulatory Visit (HOSPITAL_COMMUNITY): Payer: Self-pay

## 2022-07-07 MED ORDER — SERTRALINE HCL 100 MG PO TABS
150.0000 mg | ORAL_TABLET | Freq: Every day | ORAL | 1 refills | Status: DC
  Filled 2022-07-07: qty 45, 30d supply, fill #0

## 2022-07-12 ENCOUNTER — Other Ambulatory Visit (HOSPITAL_COMMUNITY): Payer: Self-pay

## 2022-07-12 MED ORDER — SERTRALINE HCL 100 MG PO TABS
200.0000 mg | ORAL_TABLET | Freq: Every day | ORAL | 0 refills | Status: DC
  Filled 2022-07-12 (×2): qty 180, 90d supply, fill #0

## 2022-08-09 ENCOUNTER — Other Ambulatory Visit (HOSPITAL_COMMUNITY): Payer: Self-pay

## 2022-08-09 DIAGNOSIS — F411 Generalized anxiety disorder: Secondary | ICD-10-CM | POA: Diagnosis not present

## 2022-08-09 DIAGNOSIS — F53 Postpartum depression: Secondary | ICD-10-CM | POA: Diagnosis not present

## 2022-08-09 MED ORDER — SERTRALINE HCL 100 MG PO TABS
200.0000 mg | ORAL_TABLET | Freq: Every day | ORAL | 1 refills | Status: DC
  Filled 2022-08-09 – 2022-10-10 (×2): qty 180, 90d supply, fill #0

## 2022-09-13 ENCOUNTER — Ambulatory Visit: Payer: Commercial Managed Care - PPO | Admitting: Physician Assistant

## 2022-09-13 ENCOUNTER — Encounter: Payer: Self-pay | Admitting: Physician Assistant

## 2022-09-13 VITALS — BP 118/70 | HR 84 | Temp 97.8°F | Ht 66.0 in | Wt 178.5 lb

## 2022-09-13 DIAGNOSIS — L989 Disorder of the skin and subcutaneous tissue, unspecified: Secondary | ICD-10-CM | POA: Diagnosis not present

## 2022-09-13 DIAGNOSIS — E663 Overweight: Secondary | ICD-10-CM | POA: Diagnosis not present

## 2022-09-13 DIAGNOSIS — Z0000 Encounter for general adult medical examination without abnormal findings: Secondary | ICD-10-CM | POA: Diagnosis not present

## 2022-09-13 DIAGNOSIS — F419 Anxiety disorder, unspecified: Secondary | ICD-10-CM | POA: Diagnosis not present

## 2022-09-13 DIAGNOSIS — F32A Depression, unspecified: Secondary | ICD-10-CM

## 2022-09-13 DIAGNOSIS — Z22330 Carrier of Group B streptococcus: Secondary | ICD-10-CM | POA: Insufficient documentation

## 2022-09-13 NOTE — Patient Instructions (Signed)
It was great to see you!  I've placed referral to dermatology for you   Please go to the lab for blood work.   Our office will call you with your results unless you have chosen to receive results via MyChart.  If your blood work is normal we will follow-up each year for physicals and as scheduled for chronic medical problems.  If anything is abnormal we will treat accordingly and get you in for a follow-up.  Take care,  Lelon Mast

## 2022-09-13 NOTE — Progress Notes (Signed)
Subjective:    Sheila Sexton is a 27 y.o. female and is here for a comprehensive physical exam.  HPI  Health Maintenance Due  Topic Date Due   Hepatitis C Screening  Never done   PAP-Cervical Cytology Screening  Never done   PAP SMEAR-Modifier  Never done   COVID-19 Vaccine (3 - 2023-24 season) 01/28/2022   Chief Complaint  Patient presents with   Establish Care   Annual Exam   Acute Concerns: Finger lesion Has had a lesion to her fingertip for several years Prior PCP tried to remove it but it came back Would like to see dermatology  Chronic Issues: Anxiety/Depression: Initially was diagnosed with anxiety/depression with sleep disturbance in 2019.  She was seeing a therapist and was prescribed Lexapro She found Lexapro stopped working about 3 months postpartum her 1st pregnancy. She is following up with a new therapist at [facility]. Her Zoloft dose was increased to 200 mg daily.  She also received Zulresso injection for her PPD and did well with this.  She states that she has tried melatonin before bus is not able to take it consistently due to her husband having occasional 24 hr work shifts. When her husband works 24 hrs she is unable to take the melatonin due to having to watch her 2 sons.   Health Maintenance: Immunizations -- UTD Colonoscopy -- N/A Mammogram -- N/A PAP -- pending report - UTD per patient Bone Density -- N/A Diet -- Eating healthy Exercise -- Walking everyday, about a mile a day.   Sleep habits -- Insomnia, see HPI Mood -- Stable  UTD with dentist? - yes UTD with eye doctor? - yes  Weight history: Wt Readings from Last 10 Encounters:  09/13/22 178 lb 8 oz (81 kg)  03/13/21 189 lb 6.4 oz (85.9 kg)  12/05/19 150 lb 4.8 oz (68.2 kg)  12/14/18 167 lb 5.3 oz (75.9 kg)  08/23/18 167 lb 4 oz (75.9 kg)  04/28/18 152 lb (68.9 kg)  12/22/17 137 lb 9.6 oz (62.4 kg)  11/07/17 137 lb 9.6 oz (62.4 kg)  09/28/17 134 lb (60.8 kg)   02/22/17 134 lb (60.8 kg)   Body mass index is 28.81 kg/m. No LMP recorded. (Menstrual status: IUD).  Alcohol use:  reports current alcohol use.  Tobacco use:  Tobacco Use: Low Risk  (09/13/2022)   Patient History    Smoking Tobacco Use: Never    Smokeless Tobacco Use: Never    Passive Exposure: Not on file   Eligible for lung cancer screening? No     09/13/2022    2:55 PM  Depression screen PHQ 2/9  Decreased Interest 0  Down, Depressed, Hopeless 0  PHQ - 2 Score 0  Altered sleeping 2  Tired, decreased energy 0  Change in appetite 0  Feeling bad or failure about yourself  0  Trouble concentrating 0  Moving slowly or fidgety/restless 0  Suicidal thoughts 0  PHQ-9 Score 2  Difficult doing work/chores Not difficult at all     Other providers/specialists: Patient Care Team: Jarold Motto, [place] as PCP - General (Physician Assistant) Waynard Reeds, MD as Consulting Physician (Obstetrics and Gynecology)    PMHx, SurgHx, SocialHx, Medications, and Allergies were reviewed in the Visit Navigator and updated as appropriate.   Past Medical History:  Diagnosis Date   Anxiety    Depression    meds are working, doing well   Ovarian cyst    Preeclampsia, third trimester 12/14/2018  PVC's (premature ventricular contractions)    diagnosed with second pregnancy   UTI (urinary tract infection)      Past Surgical History:  Procedure Laterality Date   CYSTOSCOPY     LAPAROSCOPY     WISDOM TOOTH EXTRACTION       Family History  Problem Relation Age of Onset   Depression Mother    Anxiety disorder Mother    COPD Father    Hypertension Father    Healthy Sister    Healthy Sister    Skin cancer Maternal Grandmother        unsure of type   Breast cancer Neg Hx    Colon cancer Neg Hx     Social History   Tobacco Use   Smoking status: Never   Smokeless tobacco: Never  Vaping Use   Vaping Use: Never used  Substance Use Topics   Alcohol use: Yes    Comment:  rarely   Drug use: No    Review of Systems:   Review of Systems  Psychiatric/Behavioral:  The patient has insomnia.     Objective:   BP 118/70 (BP Location: Left Arm, Patient Position: Sitting, Cuff Size: Large)   Pulse 84   Temp 97.8 F (36.6 C) (Temporal)   Ht  (1.676 m)   Wt 178 lb 8 oz (81 kg)   SpO2 97%   Breastfeeding No   BMI 28.81 kg/m  Body mass index is 28.81 kg/m.   General Appearance:    Alert, cooperative, no distress, appears stated age  Head:    Normocephalic, without obvious abnormality, atraumatic  Eyes:    PERRL, conjunctiva/corneas clear, EOM's intact, fundi    benign, both eyes  Ears:    Normal TM's and external ear canals, both ears  Nose:   Nares normal, septum midline, mucosa normal, no drainage    or sinus tenderness  Throat:   Lips, mucosa, and tongue normal; teeth and gums normal  Neck:   Supple, symmetrical, trachea midline, no adenopathy;    thyroid:  no enlargement/tenderness/nodules; no carotid   bruit or JVD  Back:     Symmetric, no curvature, ROM normal, no CVA tenderness  Lungs:     Clear to auscultation bilaterally, respirations unlabored  Chest Wall:    No tenderness or deformity   Heart:    Regular rate and rhythm, S1 and S2 normal, no murmur, rub or gallop  Breast Exam:    Deferred  Abdomen:     Soft, non-tender, bowel sounds active all four quadrants,    no masses, no organomegaly  Genitalia:    Deferred  Extremities:   Extremities normal, atraumatic, no cyanosis or edema Left index fingertip with raised flesh-colored lesion; no TTP/induration/fluctuance  Pulses:   2+ and symmetric all extremities  Skin:   Skin color, texture, turgor normal, no rashes or lesions  Lymph nodes:   Cervical, supraclavicular, and axillary nodes normal  Neurologic:   CNII-XII intact, normal strength, sensation and reflexes    throughout    Assessment/Plan:   Routine physical examination Today patient counseled on age appropriate routine  health concerns for screening and prevention, each reviewed and up to date or declined. Immunizations reviewed and up to date or declined. Labs ordered and reviewed. Risk factors for depression reviewed and negative. Hearing function and visual acuity are intact. ADLs screened and addressed as needed. Functional ability and level of safety reviewed and appropriate. Education, counseling and referrals performed based on assessed risks today.  Patient provided with a copy of personalized plan for preventive services.  Anxiety and depression Well controlled per patient Continue mgmt per psychiatry  Overweight Continue healthy lifestyle efforts  Finger lesion Referral to derm   I,Rachel Rivera,acting as a scribe for Jarold Motto, PA.,have documented all relevant documentation on the behalf of Jarold Motto, PA,as directed by  Jarold Motto, PA while in the presence of Jarold Motto, [place].  I, Jarold Motto, [place], have reviewed all documentation for this visit. The documentation on 09/13/22 for the exam, diagnosis, procedures, and orders are all accurate and complete.   Jarold Motto, PA-C [facility]

## 2022-09-14 ENCOUNTER — Encounter: Payer: Self-pay | Admitting: Physician Assistant

## 2022-09-14 LAB — CBC WITH DIFFERENTIAL/PLATELET
Basophils Absolute: 0 10*3/uL (ref 0.0–0.1)
Basophils Relative: 0.5 % (ref 0.0–3.0)
Eosinophils Absolute: 0.2 10*3/uL (ref 0.0–0.7)
Eosinophils Relative: 2.2 % (ref 0.0–5.0)
HCT: 40.4 % (ref 36.0–46.0)
Hemoglobin: 13.9 g/dL (ref 12.0–15.0)
Lymphocytes Relative: 28 % (ref 12.0–46.0)
Lymphs Abs: 2.1 10*3/uL (ref 0.7–4.0)
MCHC: 34.4 g/dL (ref 30.0–36.0)
MCV: 88.5 fl (ref 78.0–100.0)
Monocytes Absolute: 0.2 10*3/uL (ref 0.1–1.0)
Monocytes Relative: 3 % (ref 3.0–12.0)
Neutro Abs: 5 10*3/uL (ref 1.4–7.7)
Neutrophils Relative %: 66.3 % (ref 43.0–77.0)
Platelets: 296 10*3/uL (ref 150.0–400.0)
RBC: 4.57 Mil/uL (ref 3.87–5.11)
RDW: 13.1 % (ref 11.5–15.5)
WBC: 7.5 10*3/uL (ref 4.0–10.5)

## 2022-09-14 LAB — COMPREHENSIVE METABOLIC PANEL
ALT: 15 U/L (ref 0–35)
AST: 15 U/L (ref 0–37)
Albumin: 4.7 g/dL (ref 3.5–5.2)
Alkaline Phosphatase: 86 U/L (ref 39–117)
BUN: 15 mg/dL (ref 6–23)
CO2: 27 mEq/L (ref 19–32)
Calcium: 9.7 mg/dL (ref 8.4–10.5)
Chloride: 105 mEq/L (ref 96–112)
Creatinine, Ser: 0.71 mg/dL (ref 0.40–1.20)
GFR: 116.04 mL/min (ref >60.00)
Glucose, Bld: 84 mg/dL (ref 70–99)
Potassium: 3.7 mEq/L (ref 3.5–5.1)
Sodium: 141 mEq/L (ref 135–145)
Total Bilirubin: 0.3 mg/dL (ref 0.2–1.2)
Total Protein: 7.2 g/dL (ref 6.0–8.3)

## 2022-09-14 LAB — LIPID PANEL
Cholesterol: 158 mg/dL (ref 0–200)
HDL: 44.8 mg/dL (ref >39.00)
NonHDL: 113.41
Total CHOL/HDL Ratio: 4
Triglycerides: 201 mg/dL — ABNORMAL HIGH (ref 0.0–149.0)
VLDL: 40.2 mg/dL — ABNORMAL HIGH (ref 0.0–40.0)

## 2022-09-14 LAB — LDL CHOLESTEROL, DIRECT: Direct LDL: 101 mg/dL

## 2022-09-27 ENCOUNTER — Encounter: Payer: Self-pay | Admitting: Physician Assistant

## 2022-10-10 ENCOUNTER — Other Ambulatory Visit (HOSPITAL_COMMUNITY): Payer: Self-pay

## 2022-10-11 ENCOUNTER — Other Ambulatory Visit (HOSPITAL_COMMUNITY): Payer: Self-pay

## 2022-10-11 ENCOUNTER — Telehealth (INDEPENDENT_AMBULATORY_CARE_PROVIDER_SITE_OTHER): Payer: Commercial Managed Care - PPO | Admitting: Physician Assistant

## 2022-10-11 ENCOUNTER — Encounter: Payer: Self-pay | Admitting: Physician Assistant

## 2022-10-11 VITALS — Ht 66.0 in | Wt 175.0 lb

## 2022-10-11 DIAGNOSIS — R051 Acute cough: Secondary | ICD-10-CM | POA: Diagnosis not present

## 2022-10-11 MED ORDER — FLUCONAZOLE 150 MG PO TABS
150.0000 mg | ORAL_TABLET | Freq: Once | ORAL | 0 refills | Status: AC
  Filled 2022-10-11: qty 1, 1d supply, fill #0

## 2022-10-11 MED ORDER — AMOXICILLIN-POT CLAVULANATE 875-125 MG PO TABS
1.0000 | ORAL_TABLET | Freq: Two times a day (BID) | ORAL | 0 refills | Status: DC
  Filled 2022-10-11: qty 14, 7d supply, fill #0

## 2022-10-11 NOTE — Progress Notes (Signed)
  I acted as a Neurosurgeon for [employer], PA-C Corky Mull, LPN  Virtual Visit via Video Note   I, Jarold Motto, PA, connected with  Sheila Sexton  ([id number], 13-Oct-1995) on 10/11/22 at  1:20 PM EDT by a video-enabled telemedicine application and verified that I am speaking with the correct person using two identifiers.  Location: Patient: Home Provider: [facility] [place] office   I discussed the limitations of evaluation and management by telemedicine and the availability of in person appointments. The patient expressed understanding and agreed to proceed.    History of Present Illness: Sheila Sexton is a 27 y.o. who identifies as a female who was assigned female at birth, and is being seen today for cough. Pt c/o cough x 1 week, sore throat, lymph nodes are swollen on both sides. Pt denies fever, no nausea. Pt has tried Robitussin, Dayquil, Nyquil and Sudafed. She did COVID test on 5/10 was Negative. Taking zyrtec regularly.   Problems:  Patient Active Problem List   Diagnosis Date Noted   Carrier of group B Streptococcus 09/13/2022   Postpartum depression 10/26/2021   Abnormal ECG 12/24/2020   Recurrent syncope 12/24/2020   History of pre-eclampsia 01/04/2019   Generalized anxiety disorder with panic attacks 09/28/2017   Sleep disturbance 09/28/2017    Allergies: No Known Allergies Medications:  Current Outpatient Medications:    amoxicillin-clavulanate (AUGMENTIN) 875-125 MG tablet, Take 1 tablet by mouth 2 (two) times daily., Disp: 14 tablet, Rfl: 0   butalbital-acetaminophen-caffeine (FIORICET) 50-325-40 MG tablet, butalbital-acetaminophen-caffeine 50 mg-325 mg-40 mg tablet  TAKE 1 TABLET BY MOUTH EVERY 6 HOURS AS NEEDED FOR 6 DAYS., Disp: , Rfl:    fluconazole (DIFLUCAN) 150 MG tablet, Take 1 tablet (150 mg total) by mouth once for 1 dose., Disp: 1 tablet, Rfl: 0   levonorgestrel (MIRENA) 20 MCG/DAY IUD, 1 each by Intrauterine  route once. IUD inserted 04/2021 by GYN, good for 5 yrs, needs to be removed 04/2026., Disp: , Rfl:    sertraline (ZOLOFT) 100 MG tablet, Take 2 tablets (200 mg total) by mouth daily., Disp: 180 tablet, Rfl: 1  Observations/Objective: Patient is well-developed, well-nourished in no acute distress.  Resting comfortably at home.  Head is normocephalic, atraumatic.  No labored breathing. Speech is clear and coherent with logical content.  Patient is alert and oriented at baseline.   Assessment and Plan: 1. Acute cough No red flags on discussion.  Will initiate Augmentin for possible sinusitis per orders. Discussed taking medications as prescribed. Reviewed return precautions including worsening fever, SOB, worsening cough or other concerns. Push fluids and rest. I recommend that patient follow-up if symptoms worsen or persist despite treatment x 7-10 days, sooner if needed.   Follow Up Instructions: I discussed the assessment and treatment plan with the patient. The patient was provided an opportunity to ask questions and all were answered. The patient agreed with the plan and demonstrated an understanding of the instructions.  A copy of instructions were sent to the patient via MyChart unless otherwise noted below.   The patient was advised to call back or seek an in-person evaluation if the symptoms worsen or if the condition fails to improve as anticipated.  Jarold Motto, [place]

## 2022-10-31 DIAGNOSIS — F411 Generalized anxiety disorder: Secondary | ICD-10-CM | POA: Diagnosis not present

## 2022-10-31 DIAGNOSIS — F53 Postpartum depression: Secondary | ICD-10-CM | POA: Diagnosis not present

## 2022-11-28 DIAGNOSIS — Z1389 Encounter for screening for other disorder: Secondary | ICD-10-CM | POA: Diagnosis not present

## 2022-11-28 DIAGNOSIS — Z01419 Encounter for gynecological examination (general) (routine) without abnormal findings: Secondary | ICD-10-CM | POA: Diagnosis not present

## 2022-11-28 DIAGNOSIS — Z30431 Encounter for routine checking of intrauterine contraceptive device: Secondary | ICD-10-CM | POA: Diagnosis not present

## 2023-01-03 DIAGNOSIS — F3342 Major depressive disorder, recurrent, in full remission: Secondary | ICD-10-CM | POA: Diagnosis not present

## 2023-01-03 DIAGNOSIS — F411 Generalized anxiety disorder: Secondary | ICD-10-CM | POA: Diagnosis not present

## 2023-04-03 DIAGNOSIS — F3342 Major depressive disorder, recurrent, in full remission: Secondary | ICD-10-CM | POA: Diagnosis not present

## 2023-04-03 DIAGNOSIS — F411 Generalized anxiety disorder: Secondary | ICD-10-CM | POA: Diagnosis not present

## 2023-05-02 ENCOUNTER — Encounter: Payer: Self-pay | Admitting: Dermatology

## 2023-05-02 ENCOUNTER — Ambulatory Visit: Payer: Commercial Managed Care - PPO | Admitting: Dermatology

## 2023-05-02 ENCOUNTER — Other Ambulatory Visit (HOSPITAL_COMMUNITY): Payer: Self-pay

## 2023-05-02 DIAGNOSIS — Z808 Family history of malignant neoplasm of other organs or systems: Secondary | ICD-10-CM

## 2023-05-02 DIAGNOSIS — T8189XA Other complications of procedures, not elsewhere classified, initial encounter: Secondary | ICD-10-CM

## 2023-05-02 DIAGNOSIS — B078 Other viral warts: Secondary | ICD-10-CM

## 2023-05-02 DIAGNOSIS — T148XXA Other injury of unspecified body region, initial encounter: Secondary | ICD-10-CM

## 2023-05-02 MED ORDER — IMIQUIMOD 5 % EX CREA
TOPICAL_CREAM | CUTANEOUS | 0 refills | Status: DC
  Filled 2023-05-02: qty 28, 30d supply, fill #0
  Filled 2023-05-03: qty 24, 30d supply, fill #0

## 2023-05-02 NOTE — Progress Notes (Signed)
New Patient Visit   Subjective  Sheila Sexton is a 27 y.o. female who presents for the following:  New Pt - Finger Lesion  Patient states she has lesion located at the pointer finger on left hand that she would like to have examined. Patient reports the areas have been there for 2 years. She reports the areas are bothersome. Patient rates irritation 3 out of 10. She states that the areas have not spread. Patient reports she has previously been treated for these areas by former PCP - done cryo (59yrs ago) and manual removal (80yr ago) but it came back each time. Patient mentioned that at times pus comes out. Patient denies Hx of bx. Patient reports family history of skin cancer(s) (maternal grandmother - melanoma).  The patient has spots, moles and lesions to be evaluated, some may be new or changing and the patient may have concern these could be cancer.   The following portions of the chart were reviewed this encounter and updated as appropriate: medications, allergies, medical history  Review of Systems:  No other skin or systemic complaints except as noted in HPI or Assessment and Plan.  Objective  Well appearing patient in no apparent distress; mood and affect are within normal limits.   A focused examination was performed of the following areas: finger   Relevant exam findings are noted in the Assessment and Plan.  Left 2nd Finger Tip Verrucous papules          Assessment & Plan   Verrucous Papule (? Endophytic wart on fat pad of index finger on left hand, common wart on right dorsal middle finger) - Assessment: Patient presents with a persistent lesion on the left index finger, lasting for 2-3 years. Previous treatments include freezing and incision, which provided temporary resolution before recurrence. The lesion is tender, occasionally exudes clear or yellow fluid, and may form a scab. Differential diagnoses include wart, foreign body, or cyst. An  endophytic wart, possibly with a concurrent cyst, is suspected due to the history of temporary resolution following freezing. - Plan:   - Diagnose as a wart.   - Perform cryotherapy (freezing).   - Prescribe imiquimod cream for topical application.   - Prescribe doxycycline 100mg  BID for 7-10 days.   - Obtain an aerobic culture of the lesion.   - Recommend oral cimetidine (Tagamet) following the doxycycline course.   - Schedule a follow-up in 5-6 weeks.   - Provide instructions for imiquimod use: Apply at night, cover with a band-aid, and wash off in the morning. Begin 1 week after cryotherapy.   Other viral warts Left 2nd Finger Tip  Aerobic Culture - Left 2nd Finger Tip  Destruction of lesion - Left 2nd Finger Tip Complexity: simple   Destruction method: cryotherapy   Informed consent: discussed and consent obtained   Timeout:  patient name, date of birth, surgical site, and procedure verified Lesion destroyed using liquid nitrogen: Yes   Region frozen until ice ball extended beyond lesion: Yes   Outcome: patient tolerated procedure well with no complications   Post-procedure details: wound care instructions given    Related Medications Imiquimod 3.75 % CREA Apply to warts at night, cover with band-aid, wash off in the morning.  Wound discharge    No follow-ups on file.    Documentation: I have reviewed the above documentation for accuracy and completeness, and I agree with the above.   I, Shirron Marcha Solders, CMA, am acting as Neurosurgeon for [employer],  DO.   Langston Reusing, DO

## 2023-05-02 NOTE — Patient Instructions (Addendum)
Hello Sheila Sexton,  Thank you for visiting [place] today. Your dedication to addressing your health concerns is greatly appreciated, and I'm pleased we could explore treatment options for the spot on your finger. Here is a summary of the key instructions from today's consultation:   - Medications Prescribed:   - Imiquimod Cream:     - Application: Apply at night using a Q-tip, cover with a band-aid, and wash off in the morning.     - Start: Begin using it one week after the freezing treatment to allow the area to heal.   - Doxycycline:     - Dosage: Take one tablet in the morning and one at night.     - Duration: Continue for 7-10 days.     - Note: Ensure to take it with a big meal to avoid stomach upset.   - Cimetidine (Tagamet):     - Start: Begin this medication after completing the Doxycycline course.     - Availability: Over-the-counter.     - Purpose: Helps boost the immune system to fight warts.  - Treatment Procedure:   - Freezing Treatment: Performed today to help remove the wart. The area may be sore for a while.  - Follow-Up: Please schedule a follow-up appointment in 5-6 weeks to assess progress and determine if further treatment is necessary.  Please follow these instructions carefully and do not hesitate to contact us if you have any questions or concerns.  Warm regards,  Dr. Langston Reusing, Dermatology   Cryotherapy Aftercare  Wash gently with soap and water everyday.   Apply Vaseline and Band-Aid daily until healed.  Important Information  Due to recent changes in healthcare laws, you may see results of your pathology and/or laboratory studies on MyChart before the doctors have had a chance to review them. We understand that in some cases there may be results that are confusing or concerning to you. Please understand that not all results are received at the same time and often the doctors may need to interpret multiple results in order to provide you with the best plan  of care or course of treatment. Therefore, we ask that you please give [place] 2 business days to thoroughly review all your results before contacting the office for clarification. Should we see a critical lab result, you will be contacted sooner.   If You Need Anything After Your Visit  If you have any questions or concerns for your doctor, please call our main line at [phone] If no one answers, please leave a voicemail as directed and we will return your call as soon as possible. Messages left after 4 pm will be answered the following business day.   You may also send [place] a message via MyChart. We typically respond to MyChart messages within 1-2 business days.  For prescription refills, please ask your pharmacy to contact our office. Our fax number is [phone].  If you have an urgent issue when the clinic is closed that cannot wait until the next business day, you can page your doctor at the number below.    Please note that while we do our best to be available for urgent issues outside of office hours, we are not available 24/7.   If you have an urgent issue and are unable to reach [place], you may choose to seek medical care at your doctor's office, retail clinic, urgent care center, or emergency room.  If you have a medical emergency, please immediately call 911 or  go to the emergency department. In the event of inclement weather, please call our main line at [phone] for an update on the status of any delays or closures.  Dermatology Medication Tips: Please keep the boxes that topical medications come in in order to help keep track of the instructions about where and how to use these. Pharmacies typically print the medication instructions only on the boxes and not directly on the medication tubes.   If your medication is too expensive, please contact our office at [phone] or send [place] a message through MyChart.   We are unable to tell what your co-pay for medications will be in  advance as this is different depending on your insurance coverage. However, we may be able to find a substitute medication at lower cost or fill out paperwork to get insurance to cover a needed medication.   If a prior authorization is required to get your medication covered by your insurance company, please allow [place] 1-2 business days to complete this process.  Drug prices often vary depending on where the prescription is filled and some pharmacies may offer cheaper prices.  The website www.goodrx.com contains coupons for medications through different pharmacies. The prices here do not account for what the cost may be with help from insurance (it may be cheaper with your insurance), but the website can give you the price if you did not use any insurance.  - You can print the associated coupon and take it with your prescription to the pharmacy.  - You may also stop by our office during regular business hours and pick up a GoodRx coupon card.  - If you need your prescription sent electronically to a different pharmacy, notify our office through [facility] or by phone at [phone]

## 2023-05-03 ENCOUNTER — Other Ambulatory Visit (HOSPITAL_COMMUNITY): Payer: Self-pay

## 2023-05-05 LAB — AEROBIC CULTURE

## 2023-05-09 ENCOUNTER — Other Ambulatory Visit: Payer: Self-pay

## 2023-06-14 ENCOUNTER — Ambulatory Visit: Payer: Commercial Managed Care - PPO | Admitting: Dermatology

## 2023-06-14 ENCOUNTER — Encounter: Payer: Self-pay | Admitting: Dermatology

## 2023-06-14 VITALS — BP 108/69 | HR 81

## 2023-06-14 DIAGNOSIS — B079 Viral wart, unspecified: Secondary | ICD-10-CM

## 2023-06-14 DIAGNOSIS — I493 Ventricular premature depolarization: Secondary | ICD-10-CM | POA: Insufficient documentation

## 2023-06-14 NOTE — Patient Instructions (Addendum)

## 2023-06-14 NOTE — Progress Notes (Signed)
   Follow-Up Visit   Subjective  Sheila Sexton is a 28 y.o. female who presents for the following: Wart  Patient present today for follow up visit for warts. Patient was last evaluated on 05/02/23. At this visit pt was prescribed Prescribe imiquimod  cream for topical application doxycycline 100mg  BID for 7-10 days, we obtained an aerobic culture of the lesion (Results showed normal bacteria from her skin patient notified via my chart of results), recommend taking oral cimetidine (Tagamet) following the doxycycline course. Patient reports sxs are unchanged. Patient denies medication changes.  The following portions of the chart were reviewed this encounter and updated as appropriate: medications, allergies, medical history  Review of Systems:  No other skin or systemic complaints except as noted in HPI or Assessment and Plan.  Objective  Well appearing patient in no apparent distress; mood and affect are within normal limits.  A focused examination was performed of the following areas: Left Pointer Finger tip  Relevant exam findings are noted in the Assessment and Plan.      Left 2nd Finger Tip Verrucous papules   Assessment & Plan   Endocytic WART Exam: verrucous papule(s)  Counseling Discussed viral / HPV (Human Papilloma Virus) etiology and risk of spread /infectivity to other areas of body as well as to other people.  Multiple treatments and methods may be required to clear warts and it is possible treatment may not be successful.  Treatment risks include discoloration; scarring and there is still potential for wart recurrence.  Treatment Plan: - Recommended continuing Cimetindine daily - Recommended taking a break from the Imiqumod - We will plan to follow up in 4-6 weeks to reassess VIRAL WARTS, UNSPECIFIED TYPE Left 2nd Finger Tip Incision and Drainage - Left 2nd Finger Tip Location: Left   Informed Consent: Discussed risks (permanent scarring, light or  dark discoloration, infection, pain, bleeding, bruising, redness, damage to adjacent structures, and recurrence of the lesion) and benefits of the procedure, as well as the alternatives.  Informed consent was obtained.  Preparation: The area was prepped with alcohol.  Anesthesia: Lidocaine  1% with epinephrine  Procedure Details: An incision was made overlying the lesion. The lesion drained pus and cyst material. The area was flushed with sterile saline. Ointment and a sterile pressure dressing were applied. The patient tolerated procedure well.  Total number of lesions drained: 1  Plan: The patient was instructed on post-op care. Recommend OTC analgesia as needed for pain.   Destruction of lesion - Left 2nd Finger Tip Complexity: simple   Destruction method: cryotherapy   Informed consent: discussed and consent obtained   Timeout:  patient name, date of birth, surgical site, and procedure verified Lesion destroyed using liquid nitrogen: Yes   Cryotherapy cycles:  2 Post-procedure details: wound care instructions given    Return in about 6 weeks (around 07/26/2023) for Wart F/U.    Documentation: I have reviewed the above documentation for accuracy and completeness, and I agree with the above.  I, Jetta Ager, am acting as scribe for Louana Roup, DO.  Louana Roup, DO

## 2023-06-20 ENCOUNTER — Other Ambulatory Visit (HOSPITAL_COMMUNITY): Payer: Self-pay

## 2023-06-20 DIAGNOSIS — F3342 Major depressive disorder, recurrent, in full remission: Secondary | ICD-10-CM | POA: Diagnosis not present

## 2023-06-20 DIAGNOSIS — F411 Generalized anxiety disorder: Secondary | ICD-10-CM | POA: Diagnosis not present

## 2023-06-20 MED ORDER — SERTRALINE HCL 100 MG PO TABS
200.0000 mg | ORAL_TABLET | Freq: Every day | ORAL | 1 refills | Status: AC
  Filled 2023-06-20: qty 180, 90d supply, fill #0

## 2023-06-21 ENCOUNTER — Encounter (HOSPITAL_BASED_OUTPATIENT_CLINIC_OR_DEPARTMENT_OTHER): Payer: Self-pay

## 2023-06-21 ENCOUNTER — Other Ambulatory Visit: Payer: Self-pay

## 2023-06-21 ENCOUNTER — Emergency Department (HOSPITAL_BASED_OUTPATIENT_CLINIC_OR_DEPARTMENT_OTHER)
Admission: EM | Admit: 2023-06-21 | Discharge: 2023-06-22 | Disposition: A | Discharge status: Home / Self Care | Payer: Commercial Managed Care - PPO | Attending: Emergency Medicine | Admitting: Emergency Medicine

## 2023-06-21 ENCOUNTER — Emergency Department (HOSPITAL_BASED_OUTPATIENT_CLINIC_OR_DEPARTMENT_OTHER): Payer: Commercial Managed Care - PPO

## 2023-06-21 DIAGNOSIS — R1031 Right lower quadrant pain: Secondary | ICD-10-CM

## 2023-06-21 DIAGNOSIS — R110 Nausea: Secondary | ICD-10-CM | POA: Diagnosis not present

## 2023-06-21 DIAGNOSIS — R1032 Left lower quadrant pain: Secondary | ICD-10-CM | POA: Diagnosis not present

## 2023-06-21 LAB — URINALYSIS, ROUTINE W REFLEX MICROSCOPIC
Bacteria, UA: NONE SEEN
Bilirubin Urine: NEGATIVE
Glucose, UA: NEGATIVE mg/dL
Hgb urine dipstick: NEGATIVE
Ketones, ur: NEGATIVE mg/dL
Leukocytes,Ua: NEGATIVE
Nitrite: NEGATIVE
Protein, ur: NEGATIVE mg/dL
Specific Gravity, Urine: 1.007 (ref 1.005–1.030)
pH: 7 (ref 5.0–8.0)

## 2023-06-21 LAB — COMPREHENSIVE METABOLIC PANEL
ALT: 16 U/L (ref 0–44)
AST: 14 U/L — ABNORMAL LOW (ref 15–41)
Albumin: 5.1 g/dL — ABNORMAL HIGH (ref 3.5–5.0)
Alkaline Phosphatase: 76 U/L (ref 38–126)
Anion gap: 9 (ref 5–15)
BUN: 12 mg/dL (ref 6–20)
CO2: 26 mmol/L (ref 22–32)
Calcium: 9.6 mg/dL (ref 8.9–10.3)
Chloride: 102 mmol/L (ref 98–111)
Creatinine, Ser: 0.7 mg/dL (ref 0.44–1.00)
GFR, Estimated: >60 mL/min (ref >60)
Glucose, Bld: 89 mg/dL (ref 70–99)
Potassium: 3.7 mmol/L (ref 3.5–5.1)
Sodium: 137 mmol/L (ref 135–145)
Total Bilirubin: 0.4 mg/dL (ref 0.0–1.2)
Total Protein: 7.7 g/dL (ref 6.5–8.1)

## 2023-06-21 LAB — CBC
HCT: 41.7 % (ref 36.0–46.0)
Hemoglobin: 14.2 g/dL (ref 12.0–15.0)
MCH: 30.1 pg (ref 26.0–34.0)
MCHC: 34.1 g/dL (ref 30.0–36.0)
MCV: 88.3 fL (ref 80.0–100.0)
Platelets: 312 10*3/uL (ref 150–400)
RBC: 4.72 MIL/uL (ref 3.87–5.11)
RDW: 12.3 % (ref 11.5–15.5)
WBC: 10.6 10*3/uL — ABNORMAL HIGH (ref 4.0–10.5)
nRBC: 0 % (ref 0.0–0.2)

## 2023-06-21 LAB — PREGNANCY, URINE: Preg Test, Ur: NEGATIVE

## 2023-06-21 LAB — LIPASE, BLOOD: Lipase: 22 U/L (ref 11–51)

## 2023-06-21 MED ORDER — MORPHINE SULFATE (PF) 4 MG/ML IV SOLN
4.0000 mg | Freq: Once | INTRAVENOUS | Status: AC
  Administered 2023-06-21: 4 mg via INTRAVENOUS
  Filled 2023-06-21: qty 1

## 2023-06-21 MED ORDER — IOHEXOL 300 MG/ML  SOLN
100.0000 mL | Freq: Once | INTRAMUSCULAR | Status: AC | PRN
  Administered 2023-06-21: 85 mL via INTRAVENOUS

## 2023-06-21 MED ORDER — LORAZEPAM 2 MG/ML IJ SOLN
1.0000 mg | Freq: Once | INTRAMUSCULAR | Status: AC
  Administered 2023-06-21: 1 mg via INTRAVENOUS
  Filled 2023-06-21: qty 1

## 2023-06-21 MED ORDER — ONDANSETRON HCL 4 MG/2ML IJ SOLN
4.0000 mg | Freq: Once | INTRAMUSCULAR | Status: AC
  Administered 2023-06-21: 4 mg via INTRAVENOUS
  Filled 2023-06-21: qty 2

## 2023-06-21 NOTE — ED Triage Notes (Signed)
Pt reports lower abd pain x 3-4 days. Pt is crying is triage. Pt denies any N&V&D. Pt reports sudden onset. Pt denies any urinary s/s.

## 2023-06-21 NOTE — ED Provider Notes (Signed)
[place] EMERGENCY DEPARTMENT AT [facility] Provider Note   CSN: [id number] Arrival date & time: 06/21/23  2213     History  Chief Complaint  Patient presents with   Abdominal Pain    Sheila Sexton is a 28 y.o. female, history of ovarian cyst, who presents to the ED secondary to bilateral lower quadrant abdominal pain, this been going on for the last 2 to 3 days.  She states it came on suddenly about 2 to 3 days ago, and has been intermittent in nature.  Started in the left lower quadrant, and now is on the right lower quadrant.  Denies any vaginal discharge, urinary symptoms, including frequency, urgency.  She states she has slight nausea, but no vomiting or diarrhea.  Pain is sharp and stabbing, and radiates to her groin. States this does not feel like her ovarian cysts as in the past it has only been sharp and stabbing on one side and this affects both sides.    Home Medications Prior to Admission medications   Medication Sig Start Date End Date Taking? Authorizing Provider  imiquimod (ALDARA) 5 % cream Apply to warts at night, cover with band-aid, wash off in the morning. 05/02/23   Terri Piedra, DO  levonorgestrel (MIRENA) 20 MCG/DAY IUD 1 each by Intrauterine route once. IUD inserted 04/2021 by GYN, good for 5 yrs, needs to be removed 04/2026.    [provider]  sertraline (ZOLOFT) 100 MG tablet Take 2 tablets (200 mg total) by mouth daily. 08/09/22   Kirtland Bouchard, MD  sertraline (ZOLOFT) 100 MG tablet Take 2 tablets (200 mg total) by mouth daily. 06/20/23   Kirtland Bouchard, MD      Allergies    Patient has no known allergies.    Review of Systems   Review of Systems  Gastrointestinal:  Positive for abdominal pain and nausea. Negative for diarrhea and vomiting.    Physical Exam Updated Vital Signs BP (!) 115/53 (BP Location: Right Arm)   Pulse 84   Temp 98.3 F (36.8 C) (Oral)   Resp 16   SpO2 100%  Physical  Exam Vitals and nursing note reviewed.  Constitutional:      General: She is not in acute distress.    Appearance: She is well-developed.     Comments: +tearful  HENT:     Head: Normocephalic and atraumatic.  Eyes:     Conjunctiva/sclera: Conjunctivae normal.  Cardiovascular:     Rate and Rhythm: Normal rate and regular rhythm.     Heart sounds: No murmur heard. Pulmonary:     Effort: Pulmonary effort is normal. No respiratory distress.     Breath sounds: Normal breath sounds.  Abdominal:     Palpations: Abdomen is soft.     Tenderness: There is abdominal tenderness in the right lower quadrant and left lower quadrant. There is no guarding or rebound.  Musculoskeletal:        General: No swelling.     Cervical back: Neck supple.  Skin:    General: Skin is warm and dry.     Capillary Refill: Capillary refill takes less than 2 seconds.  Neurological:     Mental Status: She is alert.  Psychiatric:        Mood and Affect: Mood normal.     ED Results / Procedures / Treatments   Labs (all labs ordered are listed, but only abnormal results are displayed) Labs Reviewed  COMPREHENSIVE METABOLIC PANEL -  Abnormal; Notable for the following components:      Result Value   Albumin 5.1 (*)    AST 14 (*)    All other components within normal limits  CBC - Abnormal; Notable for the following components:   WBC 10.6 (*)    All other components within normal limits  URINALYSIS, ROUTINE W REFLEX MICROSCOPIC - Abnormal; Notable for the following components:   Color, Urine COLORLESS (*)    APPearance HAZY (*)    All other components within normal limits  LIPASE, BLOOD  PREGNANCY, URINE    EKG None  Radiology CT ABDOMEN PELVIS W CONTRAST Result Date: 06/21/2023 CLINICAL DATA:  Left lower quadrant pain EXAM: CT ABDOMEN AND PELVIS WITH CONTRAST TECHNIQUE: Multidetector CT imaging of the abdomen and pelvis was performed using the standard protocol following bolus administration of  intravenous contrast. RADIATION DOSE REDUCTION: This exam was performed according to the departmental dose-optimization program which includes automated exposure control, adjustment of the mA and/or kV according to patient size and/or use of iterative reconstruction technique. CONTRAST:  85mL OMNIPAQUE IOHEXOL 300 MG/ML  SOLN COMPARISON:  12/20/2012 FINDINGS: Lower chest: No acute abnormality Hepatobiliary: No focal hepatic abnormality. Gallbladder unremarkable. Pancreas: No focal abnormality or ductal dilatation. Spleen: No focal abnormality.  Normal size. Adrenals/Urinary Tract: No adrenal abnormality. No focal renal abnormality. No stones or hydronephrosis. Urinary bladder is unremarkable. Stomach/Bowel: Normal appendix. Stomach, large and [place] bowel grossly unremarkable. Vascular/Lymphatic: No evidence of aneurysm or adenopathy. Reproductive: Uterus and adnexa unremarkable. No mass. IUD in the uterus. Other: No free fluid or free air. Musculoskeletal: No acute bony abnormality. IMPRESSION: No acute findings in the abdomen or pelvis. Electronically Signed   By: Charlett Nose M.D.   On: 06/21/2023 23:23    Procedures Procedures    Medications Ordered in ED Medications  morphine (PF) 4 MG/ML injection 4 mg (4 mg Intravenous Given 06/21/23 2251)  ondansetron (ZOFRAN) injection 4 mg (4 mg Intravenous Given 06/21/23 2254)  LORazepam (ATIVAN) injection 1 mg (1 mg Intravenous Given 06/21/23 2308)  iohexol (OMNIPAQUE) 300 MG/ML solution 100 mL (85 mLs Intravenous Contrast Given 06/21/23 2315)    ED Course/ Medical Decision Making/ A&P Clinical Course as of 06/21/23 2338  Wed Jun 21, 2023  2248 Currently no ultrasound, sx came on suddenly 2-3 days ago [BS]    Clinical Course User Index [BS] Britainy Kozub, Harley Alto, PA                                 Medical Decision Making Patient is a 28 year old female, here for bilateral lower quadrant abdominal pain, has been going on for the last 2 to 3 days.  Denies  any, urinary symptoms, nausea, vomiting, diarrhea.  No fevers or chills.  Denies any kind of new sexual activity, or trauma.  We will obtain a CT abdomen pelvis, given her tenderness to palpation of bilateral lower quadrants, and her distress.  We will also give morphine, for relief.  Amount and/or Complexity of Data Reviewed Labs: ordered.    Details: Labs unremarkable except for mild leukocytosis of 10.7 K Radiology: ordered.    Details: CT abd pelvis, shows no acute findings Discussion of management or test interpretation with external provider(s): Patient is a 28 year old female, here for abdominal pain, this been going on for the last 2 to 3 days.  She is crying, she has no vaginal discharge, CT abdomen pelvis shows no acute  findings.  Blood work is unremarkable except for mild leukocytosis 10.7 K, this may be reactive from the patient's anxiety, nausea, pain.  Pain source is unclear at this time.  I think it is unlikely to be ovarian as it is bilateral quadrants, and the pain has been going on for the last 2 to 3 days.  I will have her follow-up with her primary care doctor, she is in no distress at this time.  We discussed using heating pad, or Epsom salt bath, possible bladder spasm?.  Informed on return precautions she voiced understanding.  Discharged home  Risk Prescription drug management.    Final Clinical Impression(s) / ED Diagnoses Final diagnoses:  Bilateral lower abdominal pain    Rx / DC Orders ED Discharge Orders     None         Veola Cafaro, Harley Alto, PA 06/21/23 2338    Royanne Foots, DO 06/26/23 1148

## 2023-06-21 NOTE — Discharge Instructions (Signed)
Please follow-up with your primary care doctor, the cause of your pain is unclear at this time.  Your CT abdomen pelvis was reassuring, and your labs are reassuring as well.  Return to the ER if you feel like your pain is intractable, you have intractable nausea, or vomiting.

## 2023-06-22 MED ORDER — ONDANSETRON 4 MG PO TBDP
4.0000 mg | ORAL_TABLET | Freq: Once | ORAL | Status: AC
  Administered 2023-06-22: 4 mg via ORAL
  Filled 2023-06-22: qty 1

## 2023-06-26 ENCOUNTER — Encounter: Payer: Self-pay | Admitting: Physician Assistant

## 2023-06-26 ENCOUNTER — Ambulatory Visit: Payer: Commercial Managed Care - PPO | Admitting: Physician Assistant

## 2023-06-26 ENCOUNTER — Other Ambulatory Visit (HOSPITAL_COMMUNITY)
Admission: RE | Admit: 2023-06-26 | Discharge: 2023-06-26 | Disposition: A | Discharge status: Home / Self Care | Payer: Commercial Managed Care - PPO | Source: Ambulatory Visit | Attending: Physician Assistant | Admitting: Physician Assistant

## 2023-06-26 VITALS — BP 130/76 | HR 84 | Temp 97.6°F | Ht 66.0 in | Wt 184.5 lb

## 2023-06-26 DIAGNOSIS — R102 Pelvic and perineal pain: Secondary | ICD-10-CM | POA: Insufficient documentation

## 2023-06-26 DIAGNOSIS — R3915 Urgency of urination: Secondary | ICD-10-CM | POA: Diagnosis not present

## 2023-06-26 LAB — POCT URINALYSIS DIPSTICK
Bilirubin, UA: NEGATIVE
Blood, UA: NEGATIVE
Glucose, UA: NEGATIVE
Leukocytes, UA: NEGATIVE
Nitrite, UA: NEGATIVE
Protein, UA: NEGATIVE
Spec Grav, UA: >=1.03 — AB (ref 1.010–1.025)
Urobilinogen, UA: 0.2 U/dL
pH, UA: 5.5 (ref 5.0–8.0)

## 2023-06-26 NOTE — Progress Notes (Signed)
Sheila Sexton is a 27 y.o. female here for a new problem.  History of Present Illness:   Chief Complaint  Patient presents with   Urinary Urgency    Pt c/o bladder pain when bladder is full and urgency with urination, started Wed night. Denies back pain, no fever or chills.    HPI  UTI  She complains today of experiencing pain located in the lower abdominal, pelvic regions. Her pain then progressed to a constant sharp cramping which she describes as having an ongoing contraction.  She states that she also tends to experience sharp pain when she feels her bladder is full and doesn't believe that her bladder empty's fully.  She tried taking tylenol, heating pads, etc without much relief. Her symptoms started this past Wednesday. She was seen in the ED for this on 06-21-23. Imaging did not reveal any significant findings that can determine the underlying cause of her symptom.  Does report experiencing a similar episode back in 2016 but she has not experienced anything similar since and after having her kids.  She denies any associated back pain, fever, chills, or vaginal discharge.   Past Medical History:  Diagnosis Date   Anxiety    Depression    meds are working, doing well   Ovarian cyst    Preeclampsia, third trimester 12/14/2018   PVC's (premature ventricular contractions)    diagnosed with second pregnancy   UTI (urinary tract infection)      Social History   Tobacco Use   Smoking status: Never    Passive exposure: Never   Smokeless tobacco: Never  Vaping Use   Vaping status: Never Used  Substance Use Topics   Alcohol use: Yes    Comment: rarely   Drug use: No    Past Surgical History:  Procedure Laterality Date   CYSTOSCOPY     LAPAROSCOPY     WISDOM TOOTH EXTRACTION      Family History  Problem Relation Age of Onset   Depression Mother    Anxiety disorder Mother    COPD Father    Hypertension Father    Healthy Sister    Healthy Sister     Skin cancer Maternal Grandmother        unsure of type   Breast cancer Neg Hx    Colon cancer Neg Hx     No Known Allergies  Current Medications:   Current Outpatient Medications:    imiquimod (ALDARA) 5 % cream, Apply to warts at night, cover with band-aid, wash off in the morning., Disp: 24 each, Rfl: 0   levonorgestrel (MIRENA) 20 MCG/DAY IUD, 1 each by Intrauterine route once. IUD inserted 04/2021 by GYN, good for 5 yrs, needs to be removed 04/2026., Disp: , Rfl:    sertraline (ZOLOFT) 100 MG tablet, Take 2 tablets (200 mg total) by mouth daily., Disp: 180 tablet, Rfl: 1   Review of Systems:   Review of Systems  Constitutional:  Negative for chills and fever.  Gastrointestinal:  Positive for abdominal pain.  Genitourinary:  Positive for urgency.  Musculoskeletal:  Negative for back pain.    Vitals:   Vitals:   06/26/23 0859  BP: 130/76  Pulse: 84  Temp: 97.6 F (36.4 C)  TempSrc: Temporal  SpO2: 97%  Weight: 184 lb 8 oz (83.7 kg)  Height: 5\' 6"  (1.676 m)     Body mass index is 29.78 kg/m.  Physical Exam:   Physical Exam Vitals and nursing note reviewed.  Exam conducted with a chaperone present.  Constitutional:      General: She is not in acute distress.    Appearance: She is well-developed. She is not ill-appearing or toxic-appearing.  Cardiovascular:     Rate and Rhythm: Normal rate and regular rhythm.     Pulses: Normal pulses.     Heart sounds: Normal heart sounds, S1 normal and S2 normal.  Pulmonary:     Effort: Pulmonary effort is normal.     Breath sounds: Normal breath sounds.  Genitourinary:    Vagina: Tenderness present.     Comments: Unable to find cervix on my exam; she is very tender Skin:    General: Skin is warm and dry.  Neurological:     Mental Status: She is alert.     GCS: GCS eye subscore is 4. GCS verbal subscore is 5. GCS motor subscore is 6.  Psychiatric:        Speech: Speech normal.        Behavior: Behavior normal.  Behavior is cooperative.    Results for orders placed or performed in visit on 06/26/23  POCT urinalysis dipstick  Result Value Ref Range   Color, UA dark yellow    Clarity, UA slightly cloudy    Glucose, UA Negative Negative   Bilirubin, UA Negative    Ketones, UA Trace    Spec Grav, UA >=1.030 (A) 1.010 - 1.025   Blood, UA Negative    pH, UA 5.5 5.0 - 8.0   Protein, UA Negative Negative   Urobilinogen, UA 0.2 0.2 or 1.0 E.U./dL   Nitrite, UA Negative    Leukocytes, UA Negative Negative   Appearance     Odor       Assessment and Plan:   Urinary urgency Urinalysis today is not overly concerning Will order culture for further evaluation and add on treatment based on results  Vaginal pain Unable to complete exam due to patient's discomfort I did send secure chat to her gynecologist Dr Reina Fuse who agrees patient should be seen in her office and relayed that she will have her staff contact [name] to be seen today Vaginal swab obtained to assess for yeast/bacterial vaginosis   Jarold Motto, PA-C  Ladona Mow M Kadhim,acting as a scribe for Jarold Motto, PA.,have documented all relevant documentation on the behalf of Jarold Motto, PA,as directed by  Jarold Motto, PA while in the presence of Jarold Motto, [place].   I, Jarold Motto, [place], have reviewed all documentation for this visit. The documentation on 06/26/23 for the exam, diagnosis, procedures, and orders are all accurate and complete.

## 2023-06-26 NOTE — Patient Instructions (Signed)
It was great to see you!  I will be in touch with your results  Your gynecology office will reach out to see you soon  Take care,  Jarold Motto PA-C

## 2023-06-27 ENCOUNTER — Other Ambulatory Visit (HOSPITAL_COMMUNITY): Payer: Self-pay

## 2023-06-27 ENCOUNTER — Other Ambulatory Visit: Payer: Self-pay | Admitting: Physician Assistant

## 2023-06-27 ENCOUNTER — Encounter: Payer: Self-pay | Admitting: Physician Assistant

## 2023-06-27 LAB — CERVICOVAGINAL ANCILLARY ONLY
Bacterial Vaginitis (gardnerella): POSITIVE — AB
Candida Glabrata: NEGATIVE
Candida Vaginitis: POSITIVE — AB
Comment: NEGATIVE
Comment: NEGATIVE
Comment: NEGATIVE

## 2023-06-27 MED ORDER — METRONIDAZOLE 500 MG PO TABS
500.0000 mg | ORAL_TABLET | Freq: Two times a day (BID) | ORAL | 0 refills | Status: AC
  Filled 2023-06-27: qty 14, 7d supply, fill #0

## 2023-06-27 MED ORDER — FLUCONAZOLE 150 MG PO TABS
ORAL_TABLET | ORAL | 0 refills | Status: DC
  Filled 2023-06-27: qty 3, 9d supply, fill #0

## 2023-06-28 LAB — URINE CULTURE
MICRO NUMBER:: 16005474
Result:: NO GROWTH
SPECIMEN QUALITY:: ADEQUATE

## 2023-06-30 ENCOUNTER — Other Ambulatory Visit (HOSPITAL_COMMUNITY): Payer: Self-pay

## 2023-07-27 ENCOUNTER — Encounter: Payer: Self-pay | Admitting: Dermatology

## 2023-07-27 ENCOUNTER — Ambulatory Visit: Payer: Commercial Managed Care - PPO | Admitting: Dermatology

## 2023-07-27 VITALS — BP 127/89

## 2023-07-27 DIAGNOSIS — B078 Other viral warts: Secondary | ICD-10-CM | POA: Diagnosis not present

## 2023-07-27 NOTE — Patient Instructions (Addendum)

## 2023-07-27 NOTE — Progress Notes (Signed)
   Follow-Up Visit   Subjective  Sheila Sexton is a 28 y.o. female who presents for the following: Wart  Patient present today for follow up visit. Patient was last evaluated on 06/14/23. At this visit patient was prescribed imiquimod and doxy 100mg . Recommended taking OTC cimetidine. Patient reports sxs are better. Patient denies medication changes.  The following portions of the chart were reviewed this encounter and updated as appropriate: medications, allergies, medical history  Review of Systems:  No other skin or systemic complaints except as noted in HPI or Assessment and Plan.  Objective  Well appearing patient in no apparent distress; mood and affect are within normal limits.   A focused examination was performed of the following areas: fingers   Relevant exam findings are noted in the Assessment and Plan.       Left 2nd Finger Tip Verrucous papules   Assessment & Plan  WART Exam: verrucous papule(s)  Counseling Discussed viral / HPV (Human Papilloma Virus) etiology and risk of spread /infectivity to other areas of body as well as to other people.  Multiple treatments and methods may be required to clear warts and it is possible treatment may not be successful.  Treatment risks include discoloration; scarring and there is still potential for wart recurrence.  Treatment Plan: - Continue taking imiqumod for 69mo    OTHER VIRAL WARTS Left 2nd Finger Tip Destruction of lesion - Left 2nd Finger Tip Complexity: simple   Destruction method: cryotherapy   Informed consent: discussed and consent obtained   Timeout:  patient name, date of birth, surgical site, and procedure verified Lesion destroyed using liquid nitrogen: Yes   Region frozen until ice ball extended beyond lesion: Yes   Outcome: patient tolerated procedure well with no complications   Post-procedure details: wound care instructions given   Related Medications imiquimod (ALDARA) 5 %  cream Apply to warts at night, cover with band-aid, wash off in the morning.  No follow-ups on file.    Documentation: I have reviewed the above documentation for accuracy and completeness, and I agree with the above.  I, Devaughn Savant Marcha Solders, CMA, am acting as scribe for [employer], DO.   Langston Reusing, DO

## 2023-08-24 ENCOUNTER — Other Ambulatory Visit (HOSPITAL_COMMUNITY): Payer: Self-pay

## 2023-08-24 ENCOUNTER — Telehealth: Admitting: Physician Assistant

## 2023-08-24 DIAGNOSIS — R3989 Other symptoms and signs involving the genitourinary system: Secondary | ICD-10-CM

## 2023-08-24 MED ORDER — CEPHALEXIN 500 MG PO CAPS
500.0000 mg | ORAL_CAPSULE | Freq: Two times a day (BID) | ORAL | 0 refills | Status: AC
  Filled 2023-08-24: qty 14, 7d supply, fill #0

## 2023-08-24 NOTE — Progress Notes (Signed)

## 2023-08-24 NOTE — Progress Notes (Signed)
 I have spent 5 minutes in review of e-visit questionnaire, review and updating patient chart, medical decision making and response to patient.   Piedad Climes, PA-C

## 2023-09-11 ENCOUNTER — Other Ambulatory Visit (HOSPITAL_COMMUNITY): Payer: Self-pay

## 2023-09-11 DIAGNOSIS — F3342 Major depressive disorder, recurrent, in full remission: Secondary | ICD-10-CM | POA: Diagnosis not present

## 2023-09-11 DIAGNOSIS — F411 Generalized anxiety disorder: Secondary | ICD-10-CM | POA: Diagnosis not present

## 2023-09-11 MED ORDER — SERTRALINE HCL 100 MG PO TABS
200.0000 mg | ORAL_TABLET | Freq: Every day | ORAL | 1 refills | Status: AC
  Filled 2023-09-11 – 2023-10-12 (×2): qty 180, 90d supply, fill #0

## 2023-09-25 ENCOUNTER — Other Ambulatory Visit (HOSPITAL_COMMUNITY): Payer: Self-pay

## 2023-10-02 ENCOUNTER — Other Ambulatory Visit: Payer: Self-pay | Admitting: Medical Genetics

## 2023-10-06 ENCOUNTER — Other Ambulatory Visit (HOSPITAL_COMMUNITY)
Admission: RE | Admit: 2023-10-06 | Discharge: 2023-10-06 | Disposition: A | Discharge status: Home / Self Care | Payer: Self-pay | Source: Ambulatory Visit | Attending: Medical Genetics | Admitting: Medical Genetics

## 2023-10-12 ENCOUNTER — Other Ambulatory Visit (HOSPITAL_COMMUNITY): Payer: Self-pay

## 2023-10-17 LAB — GENECONNECT MOLECULAR SCREEN: Genetic Analysis Overall Interpretation: NEGATIVE

## 2023-12-05 DIAGNOSIS — Z1389 Encounter for screening for other disorder: Secondary | ICD-10-CM | POA: Diagnosis not present

## 2023-12-05 DIAGNOSIS — Z124 Encounter for screening for malignant neoplasm of cervix: Secondary | ICD-10-CM | POA: Diagnosis not present

## 2023-12-05 DIAGNOSIS — Z130 Encounter for screening for diseases of the blood and blood-forming organs and certain disorders involving the immune mechanism: Secondary | ICD-10-CM | POA: Diagnosis not present

## 2023-12-05 DIAGNOSIS — Z01419 Encounter for gynecological examination (general) (routine) without abnormal findings: Secondary | ICD-10-CM | POA: Diagnosis not present

## 2023-12-05 DIAGNOSIS — Z30431 Encounter for routine checking of intrauterine contraceptive device: Secondary | ICD-10-CM | POA: Diagnosis not present

## 2023-12-08 LAB — HM PAP SMEAR

## 2023-12-18 ENCOUNTER — Other Ambulatory Visit (HOSPITAL_COMMUNITY): Payer: Self-pay

## 2023-12-18 DIAGNOSIS — F411 Generalized anxiety disorder: Secondary | ICD-10-CM | POA: Diagnosis not present

## 2023-12-18 DIAGNOSIS — F3342 Major depressive disorder, recurrent, in full remission: Secondary | ICD-10-CM | POA: Diagnosis not present

## 2023-12-18 MED ORDER — SERTRALINE HCL 100 MG PO TABS
200.0000 mg | ORAL_TABLET | Freq: Every day | ORAL | 0 refills | Status: AC
  Filled 2023-12-18 – 2023-12-25 (×2): qty 180, 90d supply, fill #0

## 2023-12-25 ENCOUNTER — Other Ambulatory Visit (HOSPITAL_COMMUNITY): Payer: Self-pay

## 2023-12-25 ENCOUNTER — Other Ambulatory Visit: Payer: Self-pay

## 2024-01-03 ENCOUNTER — Other Ambulatory Visit (HOSPITAL_COMMUNITY): Payer: Self-pay

## 2024-03-13 ENCOUNTER — Encounter: Payer: Self-pay | Admitting: Family Medicine

## 2024-03-13 ENCOUNTER — Other Ambulatory Visit (HOSPITAL_COMMUNITY): Payer: Self-pay

## 2024-03-13 MED ORDER — NITROFURANTOIN MONOHYD MACRO 100 MG PO CAPS
100.0000 mg | ORAL_CAPSULE | Freq: Two times a day (BID) | ORAL | 0 refills | Status: DC
  Filled 2024-03-13: qty 14, 7d supply, fill #0

## 2024-04-22 ENCOUNTER — Encounter: Payer: Self-pay | Admitting: Physician Assistant

## 2024-04-23 ENCOUNTER — Telehealth (INDEPENDENT_AMBULATORY_CARE_PROVIDER_SITE_OTHER): Admitting: Physician Assistant

## 2024-04-23 ENCOUNTER — Encounter: Payer: Self-pay | Admitting: Physician Assistant

## 2024-04-23 ENCOUNTER — Other Ambulatory Visit (HOSPITAL_COMMUNITY): Payer: Self-pay

## 2024-04-23 VITALS — Ht 66.0 in | Wt 170.0 lb

## 2024-04-23 DIAGNOSIS — F419 Anxiety disorder, unspecified: Secondary | ICD-10-CM

## 2024-04-23 DIAGNOSIS — F32A Depression, unspecified: Secondary | ICD-10-CM | POA: Diagnosis not present

## 2024-04-23 DIAGNOSIS — Z87898 Personal history of other specified conditions: Secondary | ICD-10-CM

## 2024-04-23 MED ORDER — ONDANSETRON 4 MG PO TBDP
4.0000 mg | ORAL_TABLET | Freq: Three times a day (TID) | ORAL | 2 refills | Status: AC | PRN
  Filled 2024-04-23: qty 30, 10d supply, fill #0

## 2024-04-23 MED ORDER — SCOPOLAMINE 1 MG/3DAYS TD PT72
1.0000 | MEDICATED_PATCH | TRANSDERMAL | 1 refills | Status: AC
  Filled 2024-04-23: qty 10, 30d supply, fill #0

## 2024-04-23 NOTE — Progress Notes (Signed)
 Virtual Visit via Video Note   I, Lucie Buttner, connected with  Sheila Sexton Sheila Sexton  ([id number], July 19, 1995) on 04/23/24 at 11:20 AM EST by a video-enabled telemedicine application and verified that I am speaking with the correct person using two identifiers.  Location: Patient: Home Provider: [place] [facility] [place] office   I discussed the limitations of evaluation and management by telemedicine and the availability of in person appointments. The patient expressed understanding and agreed to proceed.    Discussed the use of AI scribe software for clinical note transcription with the patient, who gave verbal consent to proceed.  History of Present Illness   Sheila Sexton is a 28 year old female who presents with a request for medication to manage motion sickness during an upcoming road trip.  She has motion sickness with long road trips and requests refills of Zofran  and scopolamine  patches for an upcoming drive to [place] . Zofran  and scopolamine  have helped her in the past, and she prefers the dissolvable form of Zofran .  She takes Zoloft  200 mg daily, managed by psychiatry at [facility], and recently received a refill, so she does not need one today. She asks about having future Zoloft  prescriptions mailed to her because of recent insurance changes.     She would like us  to take over this prescription   Problems:  Patient Active Problem List   Diagnosis Date Noted   Ventricular premature complex 06/14/2023   Carrier of group B Streptococcus 09/13/2022   Postpartum depression 10/26/2021   Abnormal ECG 12/24/2020   Recurrent syncope 12/24/2020   History of pre-eclampsia 01/04/2019   Generalized anxiety disorder with panic attacks 09/28/2017   Sleep disturbance 09/28/2017    Allergies: No Known Allergies Medications:  Current Outpatient Medications:    levonorgestrel  (MIRENA ) 20 MCG/DAY IUD, 1 each by Intrauterine route once. IUD inserted 04/2021 by GYN,  good for 5 yrs, needs to be removed 04/2026., Disp: , Rfl:    sertraline  (ZOLOFT ) 100 MG tablet, Take 2 tablets (200 mg total) by mouth daily., Disp: 180 tablet, Rfl: 1   sertraline  (ZOLOFT ) 100 MG tablet, Take 2 tablets (200 mg total) by mouth daily., Disp: 180 tablet, Rfl: 1   sertraline  (ZOLOFT ) 100 MG tablet, Take 2 tablets (200 mg total) by mouth daily., Disp: 180 tablet, Rfl: 0  Observations/Objective: Patient is well-developed, well-nourished in no acute distress.  Resting comfortably  at home.  Head is normocephalic, atraumatic.  No labored breathing.  Speech is clear and coherent with logical content.  Patient is alert and oriented at baseline.   Assessment and Plan    History of motion sickness Experiences motion sickness during long road trips, managed with Zofran  and scopolamine  patches. - Prescribed Zofran  dissolvable tablets. - Prescribed scopolamine  patches.  Anxiety and Depression  Well-managed on Zoloft  200 mg daily. Recently discharged from brexanolone treatment. Discussed home delivery of Zoloft  due to insurance. - Continue Zoloft  200 mg daily. - Reach out to our office when needs refill, we can take over this prescription when needed    Follow Up Instructions: I discussed the assessment and treatment plan with the patient. The patient was provided an opportunity to ask questions and all were answered. The patient agreed with the plan and demonstrated an understanding of the instructions.  A copy of instructions were sent to the patient via MyChart unless otherwise noted below.   The patient was advised to call back or seek an in-person evaluation if the symptoms worsen or  if the condition fails to improve as anticipated.  Lucie Buttner, [place]

## 2024-05-27 ENCOUNTER — Telehealth: Admitting: Emergency Medicine

## 2024-05-27 ENCOUNTER — Other Ambulatory Visit (HOSPITAL_COMMUNITY): Payer: Self-pay

## 2024-05-27 DIAGNOSIS — J111 Influenza due to unidentified influenza virus with other respiratory manifestations: Secondary | ICD-10-CM | POA: Diagnosis not present

## 2024-05-27 MED ORDER — OSELTAMIVIR PHOSPHATE 75 MG PO CAPS
75.0000 mg | ORAL_CAPSULE | Freq: Two times a day (BID) | ORAL | 0 refills | Status: AC
  Filled 2024-05-27: qty 10, 5d supply, fill #0

## 2024-05-27 MED ORDER — LIDOCAINE VISCOUS HCL 2 % MT SOLN
15.0000 mL | OROMUCOSAL | 0 refills | Status: AC | PRN
  Filled 2024-05-27: qty 100, 5d supply, fill #0

## 2024-05-27 NOTE — Progress Notes (Signed)
 E visit for Flu like symptoms   We are sorry that you are not feeling well.  Here is how we plan to help! Based on what you have shared with me it looks like you may have a respiratory virus that may be influenza.  Influenza or the flu is  an infection caused by a respiratory virus. The flu virus is highly contagious and persons who did not receive their yearly flu vaccination may catch the flu from close contact.  We have anti-viral medications to treat the viruses that cause this infection. They are not a cure and only shorten the course of the infection. These prescriptions are most effective when they are given within the first 2 days of flu symptoms. Antiviral medications are indicated if you have a high risk of complications from the flu. You should  also consider an antiviral medication if you are in close contact with someone who is at risk. These medications can help patients avoid complications from the flu but have side effects that you should know.   Possible side effects from Tamiflu or oseltamivir include nausea, vomiting, diarrhea, dizziness, headaches, eye redness, sleep problems or other respiratory symptoms. You should not take Tamiflu if you have an allergy to oseltamivir or any to the ingredients in Tamiflu.  Based upon your symptoms and potential risk factors I have prescribed Oseltamivir (Tamiflu).  It has been sent to your designated pharmacy.  You will take one 75 mg capsule orally twice a day for the next 5 days.   For nasal congestion, you may use an oral decongestant such as Mucinex D or if you have glaucoma or high blood pressure use plain Mucinex.  Saline nasal spray or nasal drops can help and can safely be used as often as needed for congestion.  If you have a sore or scratchy throat, use a saltwater gargle-  to  teaspoon of salt dissolved in a 4-ounce to 8-ounce glass of warm water.  Gargle the solution for approximately 15-30 seconds and then spit.  It is  important not to swallow the solution.  You can also use throat lozenges/cough drops and Chloraseptic spray to help with throat pain or discomfort.  Warm or cold liquids can also be helpful in relieving throat pain.  For headache, pain or general discomfort, you can use Ibuprofen  or Tylenol  as directed.   Some authorities believe that zinc sprays or the use of Echinacea may shorten the course of your symptoms.  I have prescribed the following medications to help lessen symptoms: viscous lidocaine  to help with your sore throat  You are to isolate at home until you have been fever-free for at least 24 hours without a fever-reducing medication, and symptoms have been steadily improving for 24 hours.  If you must be around other household members who do not have symptoms, you need to make sure that both you and the family members are masking consistently with a high-quality mask.  If you note any worsening of symptoms despite treatment, please seek an in-person evaluation ASAP. If you note any significant shortness of breath or any chest pain, please seek ED evaluation. Please do not delay care!  ANYONE WHO HAS FLU SYMPTOMS SHOULD: Stay home. The flu is highly contagious and going out or to work exposes others! Be sure to drink plenty of fluids. Water is fine as well as fruit juices, sodas and electrolyte beverages. You may want to stay away from caffeine or alcohol. If you are nauseated, try taking  small sips of liquids. How do you know if you are getting enough fluid? Your urine should be a pale yellow or almost colorless. Get rest. Taking a steamy shower or using a humidifier may help nasal congestion and ease sore throat pain. Using a saline nasal spray works much the same way. Cough drops, hard candies and sore throat lozenges may ease your cough. Line up a caregiver. Have someone check on you regularly.  GET HELP RIGHT AWAY IF: You cannot keep down liquids or your medications. You become  short of breath Your fell like you are going to pass out or loose consciousness. Your symptoms persist after you have completed your treatment plan  MAKE SURE YOU  Understand these instructions. Will watch your condition. Will get help right away if you are not doing well or get worse.  Your e-visit answers were reviewed by a board certified advanced clinical practitioner to complete your personal care plan.  Depending on the condition, your plan could have included both over the counter or prescription medications.  If there is a problem please reply  once you have received a response from your provider.  Your safety is important to us .  If you have drug allergies check your prescription carefully.    You can use MyChart to ask questions about todays visit, request a non-urgent call back, or ask for a work or school excuse for 24 hours related to this e-Visit. If it has been greater than 24 hours you will need to follow up with your provider, or enter a new e-Visit to address those concerns.  You will get an e-mail in the next two days asking about your experience.  I hope that your e-visit has been valuable and will speed your recovery. Thank you for using e-visits.   I have spent 5 minutes in review of e-visit questionnaire, review and updating patient chart, medical decision making and response to patient.   Jon Belt, PhD, FNP-BC
